# Patient Record
Sex: Female | Born: 1990 | Race: Black or African American | Hispanic: No | Smoking: Former smoker
Health system: Southern US, Community
[De-identification: ages and names within clinical notes are randomized; demographics above are authoritative.]

## PROBLEM LIST (undated history)

## (undated) DIAGNOSIS — Z87898 Personal history of other specified conditions: Secondary | ICD-10-CM

## (undated) DIAGNOSIS — F1110 Opioid abuse, uncomplicated: Secondary | ICD-10-CM

## (undated) DIAGNOSIS — F1491 Cocaine use, unspecified, in remission: Secondary | ICD-10-CM

## (undated) HISTORY — DX: Personal history of other specified conditions: Z87.898

## (undated) HISTORY — DX: Cocaine use, unspecified, in remission: F14.91

## (undated) SURGERY — Surgical Case
Anesthesia: Regional

---
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
## 2010-05-08 ENCOUNTER — Emergency Department (HOSPITAL_COMMUNITY): Admission: EM | Admit: 2010-05-08 | Discharge: 2010-05-08 | Payer: Self-pay | Admitting: Emergency Medicine

## 2010-05-11 ENCOUNTER — Emergency Department (HOSPITAL_COMMUNITY): Admission: EM | Admit: 2010-05-11 | Discharge: 2010-05-11 | Payer: Self-pay | Admitting: Emergency Medicine

## 2010-05-21 ENCOUNTER — Ambulatory Visit: Payer: Self-pay | Admitting: Obstetrics and Gynecology

## 2010-05-21 ENCOUNTER — Inpatient Hospital Stay (HOSPITAL_COMMUNITY): Admission: AD | Admit: 2010-05-21 | Discharge: 2010-05-21 | Payer: Self-pay | Admitting: Obstetrics and Gynecology

## 2010-09-30 LAB — HCG, QUANTITATIVE, PREGNANCY: hCG, Beta Chain, Quant, S: <2 m[IU]/mL (ref <5)

## 2010-10-01 LAB — CBC
HCT: 38.3 % (ref 36.0–46.0)
Hemoglobin: 12.6 g/dL (ref 12.0–15.0)
MCH: 27.7 pg (ref 26.0–34.0)
RDW: 13.5 % (ref 11.5–15.5)
WBC: 6.9 10*3/uL (ref 4.0–10.5)

## 2010-10-01 LAB — WET PREP, GENITAL
Clue Cells Wet Prep HPF POC: NONE SEEN
Trich, Wet Prep: NONE SEEN
Yeast Wet Prep HPF POC: NONE SEEN

## 2010-10-01 LAB — DIFFERENTIAL
Basophils Absolute: 0.1 10*3/uL (ref 0.0–0.1)
Eosinophils Relative: 1 % (ref 0–5)
Lymphs Abs: 2.2 10*3/uL (ref 0.7–4.0)
Monocytes Absolute: 0.6 10*3/uL (ref 0.1–1.0)
Monocytes Relative: 9 % (ref 3–12)
Neutro Abs: 3.9 10*3/uL (ref 1.7–7.7)

## 2010-10-01 LAB — RPR: RPR Ser Ql: NONREACTIVE

## 2010-10-01 LAB — POCT I-STAT, CHEM 8
Calcium, Ion: 1.18 mmol/L (ref 1.12–1.32)
Creatinine, Ser: 1 mg/dL (ref 0.4–1.2)
Glucose, Bld: 84 mg/dL (ref 70–99)
Sodium: 138 mEq/L (ref 135–145)

## 2010-10-01 LAB — HCG, QUANTITATIVE, PREGNANCY: hCG, Beta Chain, Quant, S: 48 m[IU]/mL — ABNORMAL HIGH (ref <5)

## 2010-11-03 ENCOUNTER — Other Ambulatory Visit: Payer: Self-pay | Admitting: Family Medicine

## 2010-11-03 DIAGNOSIS — Z3689 Encounter for other specified antenatal screening: Secondary | ICD-10-CM

## 2010-11-04 ENCOUNTER — Other Ambulatory Visit: Payer: Self-pay | Admitting: Family Medicine

## 2010-11-04 DIAGNOSIS — Z3682 Encounter for antenatal screening for nuchal translucency: Secondary | ICD-10-CM

## 2010-11-12 ENCOUNTER — Ambulatory Visit (HOSPITAL_COMMUNITY)
Admission: RE | Admit: 2010-11-12 | Discharge: 2010-11-12 | Disposition: A | Discharge status: Home / Self Care | Payer: Medicaid Other | Source: Ambulatory Visit | Attending: Family Medicine | Admitting: Family Medicine

## 2010-11-12 ENCOUNTER — Encounter (HOSPITAL_COMMUNITY): Payer: Self-pay

## 2010-11-12 DIAGNOSIS — Z3682 Encounter for antenatal screening for nuchal translucency: Secondary | ICD-10-CM

## 2010-11-12 DIAGNOSIS — Z3689 Encounter for other specified antenatal screening: Secondary | ICD-10-CM | POA: Insufficient documentation

## 2010-11-12 DIAGNOSIS — O3510X0 Maternal care for (suspected) chromosomal abnormality in fetus, unspecified, not applicable or unspecified: Secondary | ICD-10-CM | POA: Insufficient documentation

## 2010-11-12 DIAGNOSIS — O351XX0 Maternal care for (suspected) chromosomal abnormality in fetus, not applicable or unspecified: Secondary | ICD-10-CM | POA: Insufficient documentation

## 2010-12-01 ENCOUNTER — Ambulatory Visit (HOSPITAL_COMMUNITY)
Admission: RE | Admit: 2010-12-01 | Discharge: 2010-12-01 | Disposition: A | Discharge status: Home / Self Care | Payer: Medicaid Other | Source: Ambulatory Visit | Attending: Family Medicine | Admitting: Family Medicine

## 2010-12-01 DIAGNOSIS — O351XX0 Maternal care for (suspected) chromosomal abnormality in fetus, not applicable or unspecified: Secondary | ICD-10-CM | POA: Insufficient documentation

## 2010-12-01 DIAGNOSIS — O3510X0 Maternal care for (suspected) chromosomal abnormality in fetus, unspecified, not applicable or unspecified: Secondary | ICD-10-CM | POA: Insufficient documentation

## 2010-12-25 ENCOUNTER — Ambulatory Visit (HOSPITAL_COMMUNITY)
Admission: RE | Admit: 2010-12-25 | Discharge: 2010-12-25 | Disposition: A | Discharge status: Home / Self Care | Payer: Medicaid Other | Source: Ambulatory Visit | Attending: Family Medicine | Admitting: Family Medicine

## 2010-12-25 DIAGNOSIS — O358XX0 Maternal care for other (suspected) fetal abnormality and damage, not applicable or unspecified: Secondary | ICD-10-CM | POA: Insufficient documentation

## 2010-12-25 DIAGNOSIS — Z363 Encounter for antenatal screening for malformations: Secondary | ICD-10-CM | POA: Insufficient documentation

## 2010-12-25 DIAGNOSIS — Z3689 Encounter for other specified antenatal screening: Secondary | ICD-10-CM

## 2010-12-25 DIAGNOSIS — Z1389 Encounter for screening for other disorder: Secondary | ICD-10-CM | POA: Insufficient documentation

## 2011-02-19 ENCOUNTER — Other Ambulatory Visit: Payer: Self-pay | Admitting: Family Medicine

## 2011-02-19 DIAGNOSIS — O4190X0 Disorder of amniotic fluid and membranes, unspecified, unspecified trimester, not applicable or unspecified: Secondary | ICD-10-CM

## 2011-02-24 ENCOUNTER — Ambulatory Visit (HOSPITAL_COMMUNITY)
Admission: RE | Admit: 2011-02-24 | Discharge: 2011-02-24 | Disposition: A | Discharge status: Home / Self Care | Payer: Medicaid Other | Source: Ambulatory Visit | Attending: Family Medicine | Admitting: Family Medicine

## 2011-02-24 DIAGNOSIS — O4190X0 Disorder of amniotic fluid and membranes, unspecified, unspecified trimester, not applicable or unspecified: Secondary | ICD-10-CM

## 2011-02-24 DIAGNOSIS — O3660X0 Maternal care for excessive fetal growth, unspecified trimester, not applicable or unspecified: Secondary | ICD-10-CM | POA: Insufficient documentation

## 2011-02-24 DIAGNOSIS — Z3689 Encounter for other specified antenatal screening: Secondary | ICD-10-CM | POA: Insufficient documentation

## 2011-04-09 LAB — COMPREHENSIVE METABOLIC PANEL WITH GFR
ALT: 15
AST: 20
Albumin: 2.9 — ABNORMAL LOW
Alkaline Phosphatase: 64
BUN: 3 — ABNORMAL LOW
CO2: 24
Calcium: 8.8
Chloride: 100
Creatinine, Ser: 0.62
GFR calc Af Amer: >60
GFR calc non Af Amer: >60
Glucose, Bld: 79
Potassium: 4.2
Sodium: 132 — ABNORMAL LOW
Total Bilirubin: 1.1
Total Protein: 6.7

## 2011-04-09 LAB — URIC ACID: Uric Acid, Serum: 3.9

## 2011-04-09 LAB — CBC
HCT: 26.6 — ABNORMAL LOW
Hemoglobin: 9 — ABNORMAL LOW
MCHC: 34
MCV: 83.6
Platelets: 206
RBC: 3.18 — ABNORMAL LOW
RDW: 13.9
WBC: 7.9

## 2011-04-09 LAB — URINALYSIS, ROUTINE W REFLEX MICROSCOPIC
Bilirubin Urine: NEGATIVE
Glucose, UA: NEGATIVE
Ketones, ur: >80 — AB
Nitrite: NEGATIVE
Protein, ur: 30 — AB
Specific Gravity, Urine: 1.02
Urobilinogen, UA: 1
pH: 6.5

## 2011-04-09 LAB — WET PREP, GENITAL
Trich, Wet Prep: NONE SEEN
Yeast Wet Prep HPF POC: NONE SEEN

## 2011-04-09 LAB — FETAL FIBRONECTIN: Fetal Fibronectin: NEGATIVE

## 2011-04-09 LAB — URINE MICROSCOPIC-ADD ON

## 2011-04-13 LAB — CBC
HCT: 31 — ABNORMAL LOW
MCHC: 33
MCV: 80.3
RBC: 3.86 — ABNORMAL LOW
WBC: 10.4

## 2011-04-13 LAB — RPR: RPR Ser Ql: NONREACTIVE

## 2011-04-18 ENCOUNTER — Encounter (HOSPITAL_COMMUNITY)
Admission: AD | Disposition: A | Discharge status: Home / Self Care | Payer: Self-pay | Source: Ambulatory Visit | Attending: Obstetrics & Gynecology

## 2011-04-18 ENCOUNTER — Inpatient Hospital Stay (HOSPITAL_COMMUNITY): Payer: Medicaid Other | Admitting: Anesthesiology

## 2011-04-18 ENCOUNTER — Inpatient Hospital Stay (HOSPITAL_COMMUNITY)
Admission: AD | Admit: 2011-04-18 | Discharge: 2011-04-21 | DRG: 765 | Disposition: A | Discharge status: Home / Self Care | Payer: Medicaid Other | Source: Ambulatory Visit | Attending: Obstetrics & Gynecology | Admitting: Obstetrics & Gynecology

## 2011-04-18 ENCOUNTER — Encounter (HOSPITAL_COMMUNITY): Payer: Self-pay | Admitting: Anesthesiology

## 2011-04-18 ENCOUNTER — Encounter (HOSPITAL_COMMUNITY): Payer: Self-pay | Admitting: *Deleted

## 2011-04-18 ENCOUNTER — Other Ambulatory Visit: Payer: Self-pay | Admitting: Obstetrics & Gynecology

## 2011-04-18 DIAGNOSIS — O479 False labor, unspecified: Secondary | ICD-10-CM

## 2011-04-18 DIAGNOSIS — O98519 Other viral diseases complicating pregnancy, unspecified trimester: Principal | ICD-10-CM | POA: Diagnosis present

## 2011-04-18 DIAGNOSIS — A6000 Herpesviral infection of urogenital system, unspecified: Secondary | ICD-10-CM

## 2011-04-18 LAB — CBC
HCT: 31.8 % — ABNORMAL LOW (ref 36.0–46.0)
Hemoglobin: 10.2 g/dL — ABNORMAL LOW (ref 12.0–15.0)
MCH: 26 pg (ref 26.0–34.0)
MCHC: 32.1 g/dL (ref 30.0–36.0)
RDW: 14.3 % (ref 11.5–15.5)

## 2011-04-18 LAB — URINALYSIS, ROUTINE W REFLEX MICROSCOPIC
Bilirubin Urine: NEGATIVE
Nitrite: NEGATIVE
Protein, ur: NEGATIVE mg/dL
Specific Gravity, Urine: <1.005 — ABNORMAL LOW (ref 1.005–1.030)
Urobilinogen, UA: 0.2 mg/dL (ref 0.0–1.0)

## 2011-04-18 LAB — WET PREP, GENITAL: Yeast Wet Prep HPF POC: NONE SEEN

## 2011-04-18 LAB — RAPID URINE DRUG SCREEN, HOSP PERFORMED
Barbiturates: NOT DETECTED
Cocaine: NOT DETECTED
Tetrahydrocannabinol: NOT DETECTED

## 2011-04-18 LAB — URINE MICROSCOPIC-ADD ON

## 2011-04-18 SURGERY — Surgical Case
Anesthesia: Spinal | Site: Abdomen | Wound class: Clean Contaminated

## 2011-04-18 MED ORDER — CEFAZOLIN SODIUM 1-5 GM-% IV SOLN: 1.0000 g | INTRAVENOUS | Status: DC

## 2011-04-18 MED ORDER — BUPIVACAINE HCL (PF) 0.5 % IJ SOLN
INTRAMUSCULAR | Status: DC | PRN
  Administered 2011-04-18: 20 mL

## 2011-04-18 MED ORDER — ZOLPIDEM TARTRATE 5 MG PO TABS: 5.0000 mg | ORAL_TABLET | Freq: Every evening | ORAL | Status: DC | PRN

## 2011-04-18 MED ORDER — WITCH HAZEL-GLYCERIN EX PADS: 1.0000 "application " | MEDICATED_PAD | CUTANEOUS | Status: DC | PRN

## 2011-04-18 MED ORDER — CEFAZOLIN SODIUM 1-5 GM-% IV SOLN
INTRAVENOUS | Status: DC | PRN
  Administered 2011-04-18: 1 g via INTRAVENOUS

## 2011-04-18 MED ORDER — FENTANYL CITRATE 0.05 MG/ML IJ SOLN
INTRAMUSCULAR | Status: DC | PRN
  Administered 2011-04-18: 15 ug via INTRATHECAL

## 2011-04-18 MED ORDER — SODIUM CHLORIDE 0.9 % IJ SOLN: 3.0000 mL | INTRAMUSCULAR | Status: DC | PRN

## 2011-04-18 MED ORDER — ONDANSETRON HCL 4 MG/2ML IJ SOLN: 4.0000 mg | INTRAMUSCULAR | Status: DC | PRN

## 2011-04-18 MED ORDER — OXYTOCIN 20 UNITS IN LACTATED RINGERS INFUSION - SIMPLE
125.0000 mL/h | INTRAVENOUS | Status: AC
  Filled 2011-04-18: qty 1000

## 2011-04-18 MED ORDER — LANOLIN HYDROUS EX OINT: 1.0000 "application " | TOPICAL_OINTMENT | CUTANEOUS | Status: DC | PRN

## 2011-04-18 MED ORDER — MEPERIDINE HCL 25 MG/ML IJ SOLN: 6.2500 mg | INTRAMUSCULAR | Status: DC | PRN

## 2011-04-18 MED ORDER — HYDROMORPHONE HCL 1 MG/ML IJ SOLN: 0.2500 mg | INTRAMUSCULAR | Status: DC | PRN

## 2011-04-18 MED ORDER — SENNOSIDES-DOCUSATE SODIUM 8.6-50 MG PO TABS
2.0000 | ORAL_TABLET | Freq: Every day | ORAL | Status: DC
  Administered 2011-04-18 – 2011-04-20 (×2): 2 via ORAL

## 2011-04-18 MED ORDER — CITRIC ACID-SODIUM CITRATE 334-500 MG/5ML PO SOLN
30.0000 mL | Freq: Once | ORAL | Status: AC
  Administered 2011-04-18: 30 mL via ORAL

## 2011-04-18 MED ORDER — SIMETHICONE 80 MG PO CHEW: 80.0000 mg | CHEWABLE_TABLET | ORAL | Status: DC | PRN

## 2011-04-18 MED ORDER — METOCLOPRAMIDE HCL 5 MG/ML IJ SOLN: 10.0000 mg | Freq: Three times a day (TID) | INTRAMUSCULAR | Status: DC | PRN

## 2011-04-18 MED ORDER — CITRIC ACID-SODIUM CITRATE 334-500 MG/5ML PO SOLN
ORAL | Status: AC
  Filled 2011-04-18: qty 15

## 2011-04-18 MED ORDER — MORPHINE SULFATE (PF) 0.5 MG/ML IJ SOLN
INTRAMUSCULAR | Status: DC | PRN
  Administered 2011-04-18: .1 mg via INTRATHECAL

## 2011-04-18 MED ORDER — KETOROLAC TROMETHAMINE 60 MG/2ML IM SOLN
INTRAMUSCULAR | Status: AC
  Filled 2011-04-18: qty 2

## 2011-04-18 MED ORDER — LACTATED RINGERS IV SOLN: INTRAVENOUS | Status: DC

## 2011-04-18 MED ORDER — ONDANSETRON HCL 4 MG PO TABS: 4.0000 mg | ORAL_TABLET | ORAL | Status: DC | PRN

## 2011-04-18 MED ORDER — OXYTOCIN 10 UNIT/ML IJ SOLN
INTRAMUSCULAR | Status: DC | PRN
  Administered 2011-04-18: 20 [IU]
  Administered 2011-04-18: 20 [IU] via INTRAMUSCULAR

## 2011-04-18 MED ORDER — FAMOTIDINE IN NACL 20-0.9 MG/50ML-% IV SOLN
INTRAVENOUS | Status: AC
  Filled 2011-04-18: qty 50

## 2011-04-18 MED ORDER — NALOXONE HCL 0.4 MG/ML IJ SOLN: 0.4000 mg | INTRAMUSCULAR | Status: DC | PRN

## 2011-04-18 MED ORDER — SODIUM CHLORIDE 0.9 % IV SOLN
1.0000 ug/kg/h | INTRAVENOUS | Status: DC | PRN
  Filled 2011-04-18: qty 2.5

## 2011-04-18 MED ORDER — KETOROLAC TROMETHAMINE 30 MG/ML IJ SOLN: 30.0000 mg | Freq: Four times a day (QID) | INTRAMUSCULAR | Status: AC | PRN

## 2011-04-18 MED ORDER — BUPIVACAINE IN DEXTROSE 0.75-8.25 % IT SOLN
INTRATHECAL | Status: DC | PRN
  Administered 2011-04-18: 1.5 mL via INTRATHECAL

## 2011-04-18 MED ORDER — IBUPROFEN 600 MG PO TABS
600.0000 mg | ORAL_TABLET | Freq: Four times a day (QID) | ORAL | Status: DC
  Administered 2011-04-18 – 2011-04-21 (×11): 600 mg via ORAL
  Filled 2011-04-18: qty 1

## 2011-04-18 MED ORDER — DIPHENHYDRAMINE HCL 50 MG/ML IJ SOLN: 25.0000 mg | INTRAMUSCULAR | Status: DC | PRN

## 2011-04-18 MED ORDER — ONDANSETRON HCL 4 MG/2ML IJ SOLN: 4.0000 mg | Freq: Three times a day (TID) | INTRAMUSCULAR | Status: DC | PRN

## 2011-04-18 MED ORDER — SIMETHICONE 80 MG PO CHEW
80.0000 mg | CHEWABLE_TABLET | Freq: Three times a day (TID) | ORAL | Status: DC
  Administered 2011-04-18 – 2011-04-21 (×11): 80 mg via ORAL

## 2011-04-18 MED ORDER — NALBUPHINE HCL 10 MG/ML IJ SOLN
5.0000 mg | INTRAMUSCULAR | Status: DC | PRN
  Administered 2011-04-18: 5 mg via SUBCUTANEOUS
  Filled 2011-04-18: qty 1

## 2011-04-18 MED ORDER — LACTATED RINGERS IV SOLN
INTRAVENOUS | Status: DC
  Administered 2011-04-18 (×3): via INTRAVENOUS

## 2011-04-18 MED ORDER — IBUPROFEN 600 MG PO TABS
600.0000 mg | ORAL_TABLET | Freq: Four times a day (QID) | ORAL | Status: DC | PRN
  Administered 2011-04-19 – 2011-04-20 (×2): 600 mg via ORAL
  Filled 2011-04-18 (×12): qty 1

## 2011-04-18 MED ORDER — PRENATAL PLUS 27-1 MG PO TABS
1.0000 | ORAL_TABLET | Freq: Every day | ORAL | Status: DC
  Administered 2011-04-18 – 2011-04-21 (×4): 1 via ORAL
  Filled 2011-04-18 (×4): qty 1

## 2011-04-18 MED ORDER — TERBUTALINE SULFATE 1 MG/ML IJ SOLN
0.2500 mg | Freq: Once | INTRAMUSCULAR | Status: AC
  Administered 2011-04-18: 0.25 mg via SUBCUTANEOUS
  Filled 2011-04-18: qty 1

## 2011-04-18 MED ORDER — VALACYCLOVIR HCL 500 MG PO TABS
1000.0000 mg | ORAL_TABLET | Freq: Every day | ORAL | Status: DC
  Administered 2011-04-18 – 2011-04-21 (×4): 1000 mg via ORAL
  Filled 2011-04-18 (×4): qty 2

## 2011-04-18 MED ORDER — SCOPOLAMINE 1 MG/3DAYS TD PT72
1.0000 | MEDICATED_PATCH | Freq: Once | TRANSDERMAL | Status: AC
  Administered 2011-04-18: 1.5 mg via TRANSDERMAL

## 2011-04-18 MED ORDER — OXYCODONE-ACETAMINOPHEN 5-325 MG PO TABS
1.0000 | ORAL_TABLET | ORAL | Status: DC | PRN
  Administered 2011-04-18 – 2011-04-19 (×2): 1 via ORAL
  Administered 2011-04-19: 2 via ORAL
  Administered 2011-04-19: 1 via ORAL
  Administered 2011-04-20 – 2011-04-21 (×2): 2 via ORAL
  Filled 2011-04-18 (×5): qty 1
  Filled 2011-04-18 (×2): qty 2

## 2011-04-18 MED ORDER — DIPHENHYDRAMINE HCL 50 MG/ML IJ SOLN: 12.5000 mg | INTRAMUSCULAR | Status: DC | PRN

## 2011-04-18 MED ORDER — DIPHENHYDRAMINE HCL 25 MG PO CAPS
25.0000 mg | ORAL_CAPSULE | ORAL | Status: DC | PRN
  Administered 2011-04-18 (×2): 25 mg via ORAL
  Filled 2011-04-18 (×2): qty 1

## 2011-04-18 MED ORDER — DIBUCAINE 1 % RE OINT: 1.0000 "application " | TOPICAL_OINTMENT | RECTAL | Status: DC | PRN

## 2011-04-18 MED ORDER — FAMOTIDINE IN NACL 20-0.9 MG/50ML-% IV SOLN
20.0000 mg | Freq: Once | INTRAVENOUS | Status: AC
  Administered 2011-04-18: 20 mg via INTRAVENOUS

## 2011-04-18 MED ORDER — KETOROLAC TROMETHAMINE 60 MG/2ML IM SOLN
60.0000 mg | Freq: Once | INTRAMUSCULAR | Status: AC | PRN
  Administered 2011-04-18: 60 mg via INTRAMUSCULAR

## 2011-04-18 MED ORDER — ONDANSETRON HCL 4 MG/2ML IJ SOLN
INTRAMUSCULAR | Status: DC | PRN
  Administered 2011-04-18: 4 mg via INTRAVENOUS

## 2011-04-18 MED ORDER — DIPHENHYDRAMINE HCL 25 MG PO CAPS: 25.0000 mg | ORAL_CAPSULE | Freq: Four times a day (QID) | ORAL | Status: DC | PRN

## 2011-04-18 MED ORDER — SCOPOLAMINE 1 MG/3DAYS TD PT72
MEDICATED_PATCH | TRANSDERMAL | Status: AC
  Filled 2011-04-18: qty 1

## 2011-04-18 MED ORDER — MENTHOL 3 MG MT LOZG: 1.0000 | LOZENGE | OROMUCOSAL | Status: DC | PRN

## 2011-04-18 MED ORDER — NALBUPHINE HCL 10 MG/ML IJ SOLN
5.0000 mg | INTRAMUSCULAR | Status: DC | PRN
  Filled 2011-04-18: qty 1

## 2011-04-18 MED ORDER — PHENYLEPHRINE HCL 10 MG/ML IJ SOLN
INTRAMUSCULAR | Status: DC | PRN
  Administered 2011-04-18: 80 ug via INTRAVENOUS
  Administered 2011-04-18: 40 ug via INTRAVENOUS
  Administered 2011-04-18 (×3): 80 ug via INTRAVENOUS
  Administered 2011-04-18 (×2): 40 ug via INTRAVENOUS
  Administered 2011-04-18: 80 ug via INTRAVENOUS

## 2011-04-18 MED ORDER — TETANUS-DIPHTH-ACELL PERTUSSIS 5-2.5-18.5 LF-MCG/0.5 IM SUSP
0.5000 mL | Freq: Once | INTRAMUSCULAR | Status: AC
Start: 2011-04-19 — End: 2011-04-19
  Administered 2011-04-19: 0.5 mL via INTRAMUSCULAR
  Filled 2011-04-18: qty 0.5

## 2011-04-18 SURGICAL SUPPLY — 36 items
CHLORAPREP W/TINT 26ML (MISCELLANEOUS) ×2 IMPLANT
CLOSURE STERI STRIP 1/2 X4 (GAUZE/BANDAGES/DRESSINGS) ×1 IMPLANT
CLOTH BEACON ORANGE TIMEOUT ST (SAFETY) ×2 IMPLANT
CONTAINER PREFILL 10% NBF 15ML (MISCELLANEOUS) IMPLANT
DRESSING TELFA 8X3 (GAUZE/BANDAGES/DRESSINGS) ×1 IMPLANT
ELECT REM PT RETURN 9FT ADLT (ELECTROSURGICAL) ×2
ELECTRODE REM PT RTRN 9FT ADLT (ELECTROSURGICAL) ×1 IMPLANT
GAUZE SPONGE 4X4 12PLY STRL LF (GAUZE/BANDAGES/DRESSINGS) IMPLANT
GLOVE BIO SURGEON STRL SZ 6.5 (GLOVE) ×4 IMPLANT
GOWN PREVENTION PLUS LG XLONG (DISPOSABLE) ×6 IMPLANT
KIT ABG SYR 3ML LUER SLIP (SYRINGE) IMPLANT
NDL HYPO 25X5/8 SAFETYGLIDE (NEEDLE) IMPLANT
NDL SPNL 18GX3.5 QUINCKE PK (NEEDLE) ×1 IMPLANT
NEEDLE HYPO 25X5/8 SAFETYGLIDE (NEEDLE) IMPLANT
NEEDLE SPNL 18GX3.5 QUINCKE PK (NEEDLE) ×2 IMPLANT
NS IRRIG 1000ML POUR BTL (IV SOLUTION) ×2 IMPLANT
PACK C SECTION WH (CUSTOM PROCEDURE TRAY) ×2 IMPLANT
PAD ABD 7.5X8 STRL (GAUZE/BANDAGES/DRESSINGS) ×1 IMPLANT
SLEEVE SCD COMPRESS KNEE MED (MISCELLANEOUS) IMPLANT
SPONGE GAUZE 4X4 12PLY (GAUZE/BANDAGES/DRESSINGS) ×1 IMPLANT
SUT PDS AB 0 CTX 60 (SUTURE) IMPLANT
SUT VIC AB 0 CT1 27 (SUTURE)
SUT VIC AB 0 CT1 27XBRD ANBCTR (SUTURE) IMPLANT
SUT VIC AB 0 CT1 36 (SUTURE) IMPLANT
SUT VIC AB 2-0 CT1 27 (SUTURE) ×2
SUT VIC AB 2-0 CT1 TAPERPNT 27 (SUTURE) ×1 IMPLANT
SUT VIC AB 2-0 CTX 36 (SUTURE) ×4 IMPLANT
SUT VIC AB 3-0 CT1 27 (SUTURE) ×2
SUT VIC AB 3-0 CT1 TAPERPNT 27 (SUTURE) ×1 IMPLANT
SUT VIC AB 3-0 SH 27 (SUTURE)
SUT VIC AB 3-0 SH 27X BRD (SUTURE) IMPLANT
SYR 30ML LL (SYRINGE) ×2 IMPLANT
TAPE CLOTH SURG 4X10 WHT LF (GAUZE/BANDAGES/DRESSINGS) ×1 IMPLANT
TOWEL OR 17X24 6PK STRL BLUE (TOWEL DISPOSABLE) ×4 IMPLANT
TRAY FOLEY CATH 14FR (SET/KITS/TRAYS/PACK) ×2 IMPLANT
WATER STERILE IRR 1000ML POUR (IV SOLUTION) ×2 IMPLANT

## 2011-04-18 NOTE — Anesthesia Procedure Notes (Signed)
Spinal Block  Patient location during procedure: OR Start time: 04/18/2011 2:49 AM Staffing Performed by: anesthesiologist  Preanesthetic Checklist Completed: patient identified, site marked, surgical consent, pre-op evaluation, timeout performed, IV checked, risks and benefits discussed and monitors and equipment checked Spinal Block Patient position: sitting Prep: site prepped and draped and DuraPrep Patient monitoring: heart rate, cardiac monitor, continuous pulse ox and blood pressure Approach: midline Location: L3-4 Injection technique: single-shot Needle Needle type: Sprotte  Needle gauge: 24 G Needle length: 9 cm Assessment Sensory level: T4

## 2011-04-18 NOTE — Op Note (Signed)
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  Post-operative Diagnosis: same  Surgeon: Nicholaus Bloom, MD  Assistants: Lucina Mellow, Do  Anesthesia: Spinal anesthesia  ASA Class: 3   Procedure Details   The patient was admitted for c-section from the MAU. The risks, benefits, complications, treatment options, and expected outcomes were discussed with the patient.  The patient concurred with the proposed plan, giving informed consent.  The site of surgery properly noted/marked. The patient was taken to Operating Room # 2, identified as Kelly Gordon and the procedure verified as C-Section Delivery. A Time Out was held and the above information confirmed.  After induction of anesthesia, the patient was draped and prepped in the usual sterile manner. A Pfannenstiel incision was made and carried down through the subcutaneous tissue to the fascia. Fascial incision was made and extended transversely. The rectus muscles were transected less than 1/3 of the diameter laterally and traction was used to extend the incisional opening. The peritoneum was identified and entered. A low transverse uterine incision was made. Delivered from cephalic presentation was a  Female with Apgar scores of 8 at one minute and 9 at five minutes. Weight is still pending at the time of this note. After the umbilical cord was clamped and cut cord blood was obtained for evaluation. The placenta was removed intact and appeared normal. The uterine outline, tubes and ovaries appeared normal. The uterine incision was closed with running locked sutures of 2.0 and Vicryl. Hemostasis was observed. Lavage was carried out until clear. The fascia was then reapproximated with running sutures of Vicryl. The skin was reapproximated with Vicryl.  Instrument, sponge, and needle counts were correct prior the abdominal  closure and at the conclusion of the case.   Findings: Viable infant female, normal uterus, placenta and ovaries.  Estimated Blood Loss:  500 mL         Total IV Fluids:  3000 ml         Specimens: Placenta to pathology         Complications:  None; patient tolerated the procedure well.         Disposition: PACU - hemodynamically stable.         Condition: stable

## 2011-04-18 NOTE — H&P (Signed)
Kelly Gordon is a 20 y.o. female presenting for contractions. Maternal Medical History:  Reason for admission: Reason for admission: contractions.  Contractions: Onset was 3-5 hours ago.   Frequency: regular.   Duration is approximately 90 seconds.   Perceived severity is strong.    Fetal activity: Perceived fetal activity is normal.   Last perceived fetal movement was within the past hour.    Prenatal complications: Herpes outbreak 4 days ago  Prenatal Complications - Diabetes: none.    OB History    Grav Para Term Preterm Abortions TAB SAB Ect Mult Living   3 1 1  1  1   1      Past Medical History  Diagnosis Date  . No pertinent past medical history    Past Surgical History  Procedure Date  . No past surgeries    Family History: family history is not on file. Social History:  reports that she has never smoked. She does not have any smokeless tobacco history on file. She reports that she does not drink alcohol or use illicit drugs.  Review of Systems  Constitutional: Negative.   HENT: Negative.   Eyes: Negative.   Respiratory: Negative.   Cardiovascular: Negative.   Gastrointestinal: Negative.   Genitourinary: Negative.   Musculoskeletal: Negative.   Skin: Positive for rash. Negative for itching.       Herpetic rash with lesions on labia and close to rectum  Neurological: Negative.   Psychiatric/Behavioral: Negative.       Blood pressure 108/66, pulse 101, temperature 98.5 F (36.9 C), temperature source Oral, resp. rate 20, height 5' 1.75" (1.568 m), weight 143 lb 4 oz (64.978 kg). Maternal Exam:  Uterine Assessment: Contraction strength is firm.  Contraction duration is 60 seconds. Contraction frequency is regular.   Abdomen: Estimated fetal weight is 6 pounds.   Fetal presentation: vertex  Introitus: Vulva is positive for lesion. Ferning test: negative.  Nitrazine test: not done.  Pelvis: adequate for delivery.   Cervix: Cervix evaluated by digital  exam.     Fetal Exam Fetal Monitor Review: Mode: ultrasound.   Baseline rate: 145-150.  Variability: moderate (6-25 bpm).   Pattern: accelerations present and no decelerations.    Fetal State Assessment: Category I - tracings are normal.     Physical Exam  Constitutional: She is oriented to person, place, and time. She appears well-developed and well-nourished.  HENT:  Head: Normocephalic.  Eyes: Pupils are equal, round, and reactive to light.  Neck: Normal range of motion.  Cardiovascular: Normal rate, regular rhythm and intact distal pulses.  Exam reveals friction rub. Exam reveals no gallop.   No murmur heard. Respiratory: Effort normal and breath sounds normal. No respiratory distress. She has no wheezes. She has no rales. She exhibits no tenderness.  GI: Soft. Bowel sounds are normal. She exhibits no distension. There is no tenderness.  Genitourinary:    Vulva exhibits lesion.  Musculoskeletal: Normal range of motion.  Neurological: She is alert and oriented to person, place, and time. She has normal reflexes. She displays normal reflexes. No cranial nerve deficit. Coordination normal.  Skin: Skin is warm and dry. No rash noted. She is not diaphoretic. No erythema. No pallor.  Psychiatric: She has a normal mood and affect.    Prenatal labs: ABO, Rh: --/--/O POS (10/23 1534) Antibody:   Rubella:  Immune RPR: NON REACTIVE (10/23 1537)  HBsAg:   Neg HIV:   Neg GBS:   unk  Assessment/Plan: IUP @ 34.[redacted]  wks EGA, Cervical exam 6 cm/100%/BBOW with active herpetic lesions. Will admit and perform STAT c-section due to active herpes lesions. Will cont valtrex postpartum. NICU notified. GBS, GC/Ch cultures obtained.  Nuha Degner N 04/18/2011, 1:54 AM

## 2011-04-18 NOTE — Transfer of Care (Signed)
Immediate Anesthesia Transfer of Care Note  Patient: Kelly Gordon  Procedure(s) Performed:  CESAREAN SECTION  Patient Location: PACU  Anesthesia Type: Spinal  Level of Consciousness: awake, alert , oriented and patient cooperative  Airway & Oxygen Therapy: Patient Spontanous Breathing  Post-op Assessment: Report given to PACU RN and Post -op Vital signs reviewed and stable  Post vital signs: Reviewed and stable  Complications: No apparent anesthesia complications

## 2011-04-18 NOTE — Anesthesia Postprocedure Evaluation (Signed)
Anesthesia Post Note  Patient: Kelly Gordon  Procedure(s) Performed:  CESAREAN SECTION  Anesthesia type: Spinal  Patient location: PACU  Post pain: Pain level controlled  Post assessment: Post-op Vital signs reviewed  Last Vitals:  Filed Vitals:   04/18/11 0415  BP: 84/43  Pulse: 82  Temp:   Resp: 18    Post vital signs: Reviewed  Level of consciousness: awake  Complications: No apparent anesthesia complications

## 2011-04-18 NOTE — Anesthesia Preprocedure Evaluation (Signed)
Anesthesia Evaluation  Name, MR# and DOB Patient awake  General Assessment Comment  Reviewed: Allergy & Precautions, H&P , NPO status , Patient's Chart, lab work & pertinent test results, reviewed documented beta blocker date and time   History of Anesthesia Complications Negative for: history of anesthetic complications  Airway Mallampati: I TM Distance: >3 FB Neck ROM: full    Dental  (+)    Pulmonary  clear to auscultation  breath sounds clear to auscultation none    Cardiovascular regular Normal    Neuro/Psych Negative Neurological ROS  Negative Psych ROS  GI/Hepatic/Renal negative GI ROS  negative Liver ROS  negative Renal ROS        Endo/Other  Negative Endocrine ROS (+)      Abdominal   Musculoskeletal   Hematology negative hematology ROS (+)   Peds  Reproductive/Obstetrics (+) Pregnancy (active HSV in labor)    Anesthesia Other Findings NPO since 8 pm - pizza and water            Anesthesia Physical Anesthesia Plan  ASA: II  Anesthesia Plan: Spinal   Post-op Pain Management:    Induction:   Airway Management Planned:   Additional Equipment:   Intra-op Plan:   Post-operative Plan:   Informed Consent: I have reviewed the patients History and Physical, chart, labs and discussed the procedure including the risks, benefits and alternatives for the proposed anesthesia with the patient or authorized representative who has indicated his/her understanding and acceptance.   Dental Advisory Given  Plan Discussed with: CRNA and Surgeon  Anesthesia Plan Comments:         Anesthesia Quick Evaluation

## 2011-04-18 NOTE — Progress Notes (Signed)
   PSYCHOSOCIAL ASSESSMENT ~ MATERNAL/CHILD Name: Nakeysha Pasqual        Age 20  Referral Date 04/18/11    Reason/Source NICU referral I. FAMILY/HOME ENVIRONMENT A. Child's Legal Guardian _X__Parent(s) ___Grandparent ___Foster parent ___DSS_________________ Name: Carlyle Dolly    DOB 10[birth year]19[birth year]1988  Age: 68 Address: [address] [place], [place]  Name_______________________________ DOB___/____/____ Age_____ Address________________________________________________________ B. Other Household Members/Support Persons Name_____________________Relationship____________ DOB ___/___/___ Name_____________________Relationship____________ DOB ___/___/___ Name_____________________Relationship____________ DOB ___/___/___ Name_____________________Relationship____________ DOB ___/___/___ C. Other Support: family II. PSYCHOSOCIAL DATA A. Information Source _X_Patient Interview __Family Interview __Other___________ B. Archivist __________________________________________________ _X_Medicaid County: [place]  __Private Insurance_________ __Self Pay  __Food Stamps _X_WIC __Work First __Public Housing __Section 8  __Maternity Care Coordination/Child Service Coordination/Early Intervention _________________________________________________________________School _____________________________________Grade____________ __Other________________________________________________________ C. Cultural and Environment Information Cultural Issues Impacting Care: None reported III. STRENGTHS __X_Supportive family/friends _X__Adequate Resources _X__Compliance with medical plan _X__Home prepared for Child (including basic supplies) _X__Understanding of illness  ___Other__________________________________________________________ IV. RISK FACTORS AND CURRENT PROBLEMS ____ No Problems Noted Pt Family  Substance Abuse ___ ___  Mental Illness ___ ___ Family/Relationship Issues ___  ___ Abuse/Neglect/Domestic Violence ___ ___ Financial Resources ___ ___ Transportation ___ ___ DSS Involvement ___ ___ Adjustment to Illness: Baby in NICU Knowledge/Cognitive Deficit ___ ___ Compliance with Treatment ___ ___ Basic Needs (food, housing, etc.) ___ ___ Housing Concerns ___ ___ Other_____________________________________________________________ V. SOCIAL WORK ASSESSMENT  CSW met with MOB in 3rd room floor to introduce myself, complete assessment, and evaluate how she is coping with baby's admission to NICU. CSW provided MOB with NICU brochure and explained CSW role. MOB reported baby was born 6 weeks early and therefore was admitted to NICU. MOB stated RN had recently called her and informed her that baby needed an IV since he was not eating enough. MOB stated she wished baby was not in NICU but that family and friends were supportive and assisting her during this time. MOB reports that she will have transportation to visit baby and is not working so she can visit whenever she wishes. MOB stated she has all resources needed but has not bought diapers yet due to baby's early arrival. MOB has already called [facility] and set up an appointment and stated she would call Medicaid regarding baby's arrival. CSW provided MOB with brochure/contact information and explained support services offered by NICU CSW.     VI. SOCIAL WORK PLAN ___No Further Intervention Required/No Barriers to Discharge  _X__Psychosocial Support and Ongoing Assessment of Needs  _X__Patient/Family Education:NICU brochure  ___Child Protective Services Report County___________ Date___/____/____  ___Information/Referral to [employer]_________________________  ___Other__________________________________________________________

## 2011-04-18 NOTE — ED Provider Notes (Signed)
Patient will be admitted for c-section. See H&P

## 2011-04-18 NOTE — Progress Notes (Signed)
Pt states, " I started having contractions at 7pm tonight, they are different than braxton hicks. I drank water and it helped a little bit, but now they are four minutes apart and stronger."

## 2011-04-18 NOTE — Progress Notes (Signed)
"  I have been having UC's for 4 hours every 6 mins.  I was in the clinic yesterday (04/16/11) and I wasn't contracting nor was I dilated."

## 2011-04-19 ENCOUNTER — Encounter (HOSPITAL_COMMUNITY): Payer: Self-pay | Admitting: *Deleted

## 2011-04-19 LAB — CBC
HCT: 29.5 % — ABNORMAL LOW (ref 36.0–46.0)
MCV: 81.9 fL (ref 78.0–100.0)
Platelets: 168 10*3/uL (ref 150–400)
RBC: 3.6 MIL/uL — ABNORMAL LOW (ref 3.87–5.11)
WBC: 12.2 10*3/uL — ABNORMAL HIGH (ref 4.0–10.5)

## 2011-04-19 NOTE — Anesthesia Postprocedure Evaluation (Signed)
  Anesthesia Post-op Note  Patient: Kelly Gordon  Procedure(s) Performed:  CESAREAN SECTION  Patient Location: PACU and Women's Unit  Anesthesia Type: Spinal  Level of Consciousness: awake, alert  and oriented  Airway and Oxygen Therapy: Patient Spontanous Breathing   Post-op Assessment: Patient's Cardiovascular Status Stable and Respiratory Function Stable  Post-op Vital Signs: stable  Complications: No apparent anesthesia complications

## 2011-04-19 NOTE — Progress Notes (Signed)
Encounter addended by: Len Blalock on: 04/19/2011  2:34 PM<BR>     Documentation filed: Notes Section

## 2011-04-19 NOTE — Plan of Care (Signed)
Problem: Discharge Progression Outcomes Goal: Remove staples per MD order Outcome: Not Applicable Date Met:  04/19/11 Steri strips in place.

## 2011-04-19 NOTE — Progress Notes (Signed)
Post Partum Day 1; SP PLTCS for PTL/Active Herpes Subjective: no complaints, up ad lib, voiding, tolerating PO and no flatus at this time.  Infant in NICU, desires additional day room-in if possible.    Objective: Blood pressure 99/62, pulse 74, temperature 98.3 F (36.8 C), temperature source Oral, resp. rate 18, height 5' 1.75" (1.568 m), weight 64.978 kg (143 lb 4 oz), SpO2 98.00%.  Physical Exam:  General: alert and cooperative CVS:  RRR, without murmur, gallops or rubs Lungs:  CTA biliat Lochia: appropriate Abdomen:  Distended, hypoactive bowel sounds Uterine Fundus: Firm, @ umbilicus;  Incision: healing well, no significant drainage, no dehiscence, no significant erythema DVT Evaluation: No evidence of DVT seen on physical exam.   Basename 04/19/11 0533 04/18/11 0157  HGB 9.2* 10.2*  HCT 29.5* 31.8*    Assessment/Plan: Continue PP Care Reassess in the AM   LOS: 1 day   [facility] 04/19/2011, 7:07 AM

## 2011-04-20 ENCOUNTER — Encounter (HOSPITAL_COMMUNITY): Payer: Self-pay | Admitting: Obstetrics & Gynecology

## 2011-04-20 LAB — CULTURE, BETA STREP (GROUP B ONLY)

## 2011-04-20 LAB — GC/CHLAMYDIA PROBE AMP, GENITAL
Chlamydia, DNA Probe: NEGATIVE
GC Probe Amp, Genital: NEGATIVE

## 2011-04-20 NOTE — Progress Notes (Signed)
UR chart review completed.  

## 2011-04-20 NOTE — Progress Notes (Signed)
Subjective: Postpartum Day 2: Cesarean Delivery Patient reports incisional pain, tolerating PO, + flatus and no problems voiding.    Objective: Vital signs in last 24 hours: Temp:  [97.4 F (36.3 C)-98.9 F (37.2 C)] 98.9 F (37.2 C) (10/01 0600) Pulse Rate:  [63-98] 93  (10/01 0600) Resp:  [18-20] 20  (10/01 0600) BP: (98-112)/(57-68) 112/68 mmHg (10/01 0600) SpO2:  [98 %-100 %] 98 % (10/01 0600)  Physical Exam:  General: alert, cooperative, appears stated age and no distress Lochia: appropriate Uterine Fundus: firm Incision: healing well, no significant drainage, no dehiscence, no significant erythema DVT Evaluation: No evidence of DVT seen on physical exam. Negative Homan's sign. No cords or calf tenderness. No significant calf/ankle edema.   Basename 04/19/11 0533 04/18/11 0157  HGB 9.2* 10.2*  HCT 29.5* 31.8*    Assessment/Plan: Status post Cesarean section. Doing well postoperatively.  Continue current care.  Kelly Gordon 04/20/2011, 6:53 AM

## 2011-04-21 MED ORDER — DOCUSATE SODIUM 50 MG PO CAPS: 100.0000 mg | ORAL_CAPSULE | Freq: Two times a day (BID) | ORAL | Status: AC | PRN

## 2011-04-21 MED ORDER — POLYETHYLENE GLYCOL 3350 17 G PO PACK
17.0000 g | PACK | Freq: Every day | ORAL | Status: DC
  Administered 2011-04-21: 17 g via ORAL
  Filled 2011-04-21: qty 1

## 2011-04-21 MED ORDER — OXYCODONE-ACETAMINOPHEN 5-325 MG PO TABS: 1.0000 | ORAL_TABLET | Freq: Four times a day (QID) | ORAL | Status: AC | PRN

## 2011-04-21 MED ORDER — IBUPROFEN 600 MG PO TABS: 600.0000 mg | ORAL_TABLET | Freq: Four times a day (QID) | ORAL | Status: AC | PRN

## 2011-04-21 NOTE — Progress Notes (Signed)
Pt discharged to home with significant other and toddler son.  Condition stable.  Pt and family ambulated off unit, but did not notify staff at the time of their leaving.  No equipment for home ordered at discharge.

## 2011-04-21 NOTE — Progress Notes (Signed)
Subjective: Postpartum Day 3: Cesarean Delivery Patient reports incisional pain, tolerating PO and + flatus.  Patient denies bowel movement despite attempts. Also notes that her pain is a 5/10 in her lower abdomen and thinks that another medication may control it better.   Objective: Vital signs in last 24 hours: Temp:  [98.4 F (36.9 C)-98.6 F (37 C)] 98.5 F (36.9 C) (10/02 0526) Pulse Rate:  [83-93] 93  (10/02 0526) Resp:  [18-20] 18  (10/02 0526) BP: (106-110)/(65-70) 110/70 mmHg (10/02 0526) SpO2:  [97 %-100 %] 100 % (10/02 0526)  Physical Exam:  General: alert, cooperative and no distress Lochia: appropriate Uterine Fundus: firm Incision: healing well, no significant drainage, no dehiscence, no significant erythema DVT Evaluation: No evidence of DVT seen on physical exam.   Basename 04/19/11 0533  HGB 9.2*  HCT 29.5*    Assessment/Plan: Status post Cesarean section. Doing well postoperatively.  Continue current care.   Clinton Sawyer, Daryus Sowash 04/21/2011, 7:17 AM

## 2011-04-21 NOTE — Discharge Summary (Signed)
Obstetric Discharge Summary Reason for Admission: onset of labor and cesarean section Prenatal Procedures: ultrasound Intrapartum Procedures: cesarean: low cervical, transverse Postpartum Procedures: none Complications-Operative and Postpartum: none Hemoglobin  Date Value Range Status  04/19/2011 9.2* 12.0-15.0 (g/dL) Final     HCT  Date Value Range Status  04/19/2011 29.5* 36.0-46.0 (%) Final    Brief Hoppital Course: Mrs. Momon is 20 year old female who presented on 9/29 at  @ 34.[redacted] wks EGA, Cervical exam 6 cm/100%/BBOW with active herpetic lesions. A stat c-section was performed by Dr. Marice Potter and Dr. Natale Milch. The procedure was a lower transverse c-section without complications. The patient was kept for three days post-operatively for monitoring. At the time of discharge the patient notes lower abdominal pain that is improving. She denies fever, chills, and bleeding. Her incision is healing without dehiscence or evidence of infection. She is clinically appropriate for discharge.    Discharge Diagnoses: 1. Preterm Labor 2. Genital Herpes - Active 3. Delivery via Cesarean Section   Discharge Information: Date: 04/21/2011 Activity: pelvic rest Diet: routine Medications: Colace, Percocet and Acyclovir Condition: stable Instructions: refer to practice specific booklet Discharge to: home   Newborn Data: Live born female  Birth Weight: 4 lb 9.1 oz (2073 g) APGAR: 8, 9  Baby to remain in NICU.  Mat Carne 04/21/2011, 12:18 PM

## 2011-04-24 ENCOUNTER — Encounter (HOSPITAL_COMMUNITY): Payer: Self-pay

## 2011-04-30 LAB — URINALYSIS, ROUTINE W REFLEX MICROSCOPIC
Bilirubin Urine: NEGATIVE
Glucose, UA: NEGATIVE
Hgb urine dipstick: NEGATIVE
Ketones, ur: NEGATIVE
Ketones, ur: NEGATIVE
Protein, ur: NEGATIVE
Protein, ur: NEGATIVE
Urobilinogen, UA: 0.2
Urobilinogen, UA: 1

## 2011-04-30 LAB — WET PREP, GENITAL
Trich, Wet Prep: NONE SEEN
Yeast Wet Prep HPF POC: NONE SEEN

## 2011-04-30 LAB — URINE CULTURE: Colony Count: 6000

## 2011-04-30 LAB — GC/CHLAMYDIA PROBE AMP, GENITAL
Chlamydia, DNA Probe: NEGATIVE
GC Probe Amp, Genital: NEGATIVE

## 2011-04-30 LAB — URINE MICROSCOPIC-ADD ON

## 2013-04-13 ENCOUNTER — Encounter (HOSPITAL_COMMUNITY): Payer: Self-pay | Admitting: Emergency Medicine

## 2013-04-13 ENCOUNTER — Emergency Department (HOSPITAL_COMMUNITY)
Admission: EM | Admit: 2013-04-13 | Discharge: 2013-04-13 | Disposition: A | Discharge status: Home / Self Care | Payer: Medicaid Other | Attending: Emergency Medicine | Admitting: Emergency Medicine

## 2013-04-13 DIAGNOSIS — H9202 Otalgia, left ear: Secondary | ICD-10-CM

## 2013-04-13 DIAGNOSIS — J069 Acute upper respiratory infection, unspecified: Secondary | ICD-10-CM | POA: Insufficient documentation

## 2013-04-13 DIAGNOSIS — H9209 Otalgia, unspecified ear: Secondary | ICD-10-CM | POA: Insufficient documentation

## 2013-04-13 MED ORDER — IBUPROFEN 800 MG PO TABS
800.0000 mg | ORAL_TABLET | Freq: Once | ORAL | Status: AC
  Administered 2013-04-13: 800 mg via ORAL
  Filled 2013-04-13: qty 1

## 2013-04-13 MED ORDER — OXYMETAZOLINE HCL 0.05 % NA SOLN
1.0000 | Freq: Once | NASAL | Status: AC
  Administered 2013-04-13: 1 via NASAL
  Filled 2013-04-13: qty 15

## 2013-04-13 MED ORDER — CETIRIZINE-PSEUDOEPHEDRINE ER 5-120 MG PO TB12: 1.0000 | ORAL_TABLET | Freq: Every day | ORAL | Status: DC

## 2013-04-13 MED ORDER — IBUPROFEN 800 MG PO TABS: 800.0000 mg | ORAL_TABLET | Freq: Three times a day (TID) | ORAL | Status: DC | PRN

## 2013-04-13 MED ORDER — GUAIFENESIN ER 600 MG PO TB12: 1200.0000 mg | ORAL_TABLET | Freq: Two times a day (BID) | ORAL | Status: DC

## 2013-04-13 NOTE — ED Notes (Signed)
Bed: AV40 Expected date:  Expected time:  Means of arrival:  Comments: EMS 22yo F, ear pain, URI

## 2013-04-13 NOTE — ED Provider Notes (Signed)
Medical screening examination/treatment/procedure(s) were performed by non-physician practitioner and as supervising physician I was immediately available for consultation/collaboration.  Arkie Tagliaferro, MD 04/13/13 0619 

## 2013-04-13 NOTE — ED Notes (Signed)
Brought in by EMS from home with c/o non-productive cough x 2 days and left ear pain that started 2 hours ago.  Per pt, left ear "feels like there's something fluttering iside", also states pain worse when she moves her head from side to side.

## 2013-04-13 NOTE — ED Provider Notes (Signed)
CSN: [id number]     Arrival date & time 04/13/13  0135 History   First MD Initiated Contact with Patient 04/13/13 0144     Chief Complaint  Patient presents with  . Cough  . Otalgia   HPI  History provided by the patient. Patient is a 22 year old female with no significant PMH presenting with complaints of left ear pain, runny nose, and cough. Patient has had slight runny nose and occasional cough that started 2 days ago. She does report having young children recently sick with URI symptoms. Yesterday evening around 9 PM she began having acutely worsening sharp left ear pain and slight decreased hearing. Pain is worse with some movements of head.  She did not take any medications for her symptoms. She has not had any fever, chills or sweats. No episodes of vomiting or diarrhea. He denies any civic aggravating or alleviating factors.    Past Medical History  Diagnosis Date  . No pertinent past medical history    Past Surgical History  Procedure Laterality Date  . No past surgeries    . Cesarean section  04/18/2011    Procedure: CESAREAN SECTION;  Surgeon: Hollie Salk C. Marice Potter, MD;  Location: [facility];  Service: Gynecology;  Laterality: N/A;   History reviewed. No pertinent family history. History  Substance Use Topics  . Smoking status: Never Smoker   . Smokeless tobacco: Not on file  . Alcohol Use: No   OB History   Grav Para Term Preterm Abortions TAB SAB Ect Mult Living   3 2 1 1 1  1   2      Review of Systems  Constitutional: Negative for fever, chills and diaphoresis.  HENT: Positive for hearing loss, ear pain, sore throat and rhinorrhea. Negative for congestion, neck pain, neck stiffness, sinus pressure and ear discharge.   Respiratory: Positive for cough.   Gastrointestinal: Negative for vomiting, abdominal pain and diarrhea.  All other systems reviewed and are negative.    Allergies  Review of patient's allergies indicates no known allergies.  Home Medications  No current  outpatient prescriptions on file. BP 110/68  Pulse 73  Temp(Src) 98.3 F (36.8 C) (Oral)  Resp 19  Ht 5\' 2"  (1.575 m)  Wt 144 lb (65.318 kg)  BMI 26.33 kg/m2  SpO2 100% Physical Exam  Nursing note and vitals reviewed. Constitutional: She is oriented to person, place, and time. She appears well-developed and well-nourished. No distress.  HENT:  Head: Normocephalic and atraumatic.  Mouth/Throat: Oropharynx is clear and moist.  Mild erythema of the right TM without bulge or significant middle ear effusion. Left TM is slightly erythematous with dullness and bulge.    Mild edema and rhinorrhea present in nasal mucosa.   Eyes: Conjunctivae and EOM are normal. Pupils are equal, round, and reactive to light.  Neck: Normal range of motion. Neck supple.  No meningeal signs  Cardiovascular: Normal rate and regular rhythm.   Pulmonary/Chest: Effort normal and breath sounds normal. No respiratory distress. She has no wheezes. She has no rales.  Abdominal: Soft.  Musculoskeletal: Normal range of motion.  Neurological: She is alert and oriented to person, place, and time.  Skin: Skin is warm and dry. No rash noted.  Psychiatric: She has a normal mood and affect. Her behavior is normal.    ED Course  Procedures       MDM   1. URI (upper respiratory infection)   2. Otalgia of left ear  2:25 AM patient seen and evaluated. Patient appears well in no acute distress. She does not appear severely ill or toxic.   Angus Seller, PA-C 04/13/13 [phone]

## 2014-05-21 ENCOUNTER — Encounter (HOSPITAL_COMMUNITY): Payer: Self-pay | Admitting: Emergency Medicine

## 2014-08-02 ENCOUNTER — Encounter (HOSPITAL_COMMUNITY): Payer: Self-pay | Admitting: Obstetrics & Gynecology

## 2014-12-27 ENCOUNTER — Encounter (HOSPITAL_COMMUNITY): Payer: Self-pay | Admitting: Obstetrics & Gynecology

## 2015-08-07 ENCOUNTER — Emergency Department (HOSPITAL_COMMUNITY): Payer: Medicaid Other

## 2015-08-07 ENCOUNTER — Emergency Department (HOSPITAL_COMMUNITY)
Admission: EM | Admit: 2015-08-07 | Discharge: 2015-08-07 | Disposition: A | Discharge status: Home / Self Care | Payer: Medicaid Other | Attending: Emergency Medicine | Admitting: Emergency Medicine

## 2015-08-07 ENCOUNTER — Encounter (HOSPITAL_COMMUNITY): Payer: Self-pay | Admitting: Emergency Medicine

## 2015-08-07 DIAGNOSIS — R110 Nausea: Secondary | ICD-10-CM | POA: Insufficient documentation

## 2015-08-07 DIAGNOSIS — R1031 Right lower quadrant pain: Secondary | ICD-10-CM | POA: Insufficient documentation

## 2015-08-07 DIAGNOSIS — Z3A01 Less than 8 weeks gestation of pregnancy: Secondary | ICD-10-CM | POA: Insufficient documentation

## 2015-08-07 DIAGNOSIS — O9989 Other specified diseases and conditions complicating pregnancy, childbirth and the puerperium: Secondary | ICD-10-CM | POA: Insufficient documentation

## 2015-08-07 DIAGNOSIS — Z79899 Other long term (current) drug therapy: Secondary | ICD-10-CM | POA: Insufficient documentation

## 2015-08-07 DIAGNOSIS — R1032 Left lower quadrant pain: Secondary | ICD-10-CM | POA: Insufficient documentation

## 2015-08-07 DIAGNOSIS — F17210 Nicotine dependence, cigarettes, uncomplicated: Secondary | ICD-10-CM | POA: Insufficient documentation

## 2015-08-07 DIAGNOSIS — O26899 Other specified pregnancy related conditions, unspecified trimester: Secondary | ICD-10-CM

## 2015-08-07 DIAGNOSIS — O0281 Inappropriate change in quantitative human chorionic gonadotropin (hCG) in early pregnancy: Secondary | ICD-10-CM | POA: Insufficient documentation

## 2015-08-07 DIAGNOSIS — O99331 Smoking (tobacco) complicating pregnancy, first trimester: Secondary | ICD-10-CM | POA: Insufficient documentation

## 2015-08-07 DIAGNOSIS — O280 Abnormal hematological finding on antenatal screening of mother: Secondary | ICD-10-CM

## 2015-08-07 DIAGNOSIS — O209 Hemorrhage in early pregnancy, unspecified: Secondary | ICD-10-CM

## 2015-08-07 LAB — COMPREHENSIVE METABOLIC PANEL
ALT: 14 U/L (ref 14–54)
AST: 19 U/L (ref 15–41)
Albumin: 4.1 g/dL (ref 3.5–5.0)
Alkaline Phosphatase: 52 U/L (ref 38–126)
Anion gap: 10 (ref 5–15)
CHLORIDE: 104 mmol/L (ref 101–111)
CO2: 25 mmol/L (ref 22–32)
CREATININE: 0.81 mg/dL (ref 0.44–1.00)
Calcium: 9.9 mg/dL (ref 8.9–10.3)
GFR calc Af Amer: >60 mL/min (ref >60)
GLUCOSE: 66 mg/dL (ref 65–99)
Potassium: 3.7 mmol/L (ref 3.5–5.1)
SODIUM: 139 mmol/L (ref 135–145)
Total Bilirubin: 1.2 mg/dL (ref 0.3–1.2)
Total Protein: 7.6 g/dL (ref 6.5–8.1)

## 2015-08-07 LAB — URINE MICROSCOPIC-ADD ON

## 2015-08-07 LAB — URINALYSIS, ROUTINE W REFLEX MICROSCOPIC
Bilirubin Urine: NEGATIVE
GLUCOSE, UA: NEGATIVE mg/dL
KETONES UR: NEGATIVE mg/dL
Leukocytes, UA: NEGATIVE
Nitrite: NEGATIVE
PROTEIN: NEGATIVE mg/dL
Specific Gravity, Urine: 1.005 (ref 1.005–1.030)
pH: 7 (ref 5.0–8.0)

## 2015-08-07 LAB — CBC
HCT: 38.4 % (ref 36.0–46.0)
Hemoglobin: 12.6 g/dL (ref 12.0–15.0)
MCH: 28.9 pg (ref 26.0–34.0)
MCHC: 32.8 g/dL (ref 30.0–36.0)
MCV: 88.1 fL (ref 78.0–100.0)
PLATELETS: 261 10*3/uL (ref 150–400)
RBC: 4.36 MIL/uL (ref 3.87–5.11)
RDW: 12.6 % (ref 11.5–15.5)
WBC: 8 10*3/uL (ref 4.0–10.5)

## 2015-08-07 LAB — LIPASE, BLOOD: LIPASE: 24 U/L (ref 11–51)

## 2015-08-07 LAB — HCG, QUANTITATIVE, PREGNANCY: hCG, Beta Chain, Quant, S: 125 m[IU]/mL — ABNORMAL HIGH (ref <5)

## 2015-08-07 NOTE — Discharge Instructions (Signed)
There was no evidence of pregnancy in your uterus.  It could be a miscarriage or very early.  It is important to have hcg levels followed by your doctor and perhaps a repeat ultrasound.   Human Chorionic Gonadotropin Test Human chorionic gonadotropin (hCG) is a hormone produced during pregnancy by the cells that form the placenta. The placenta is the organ that grows inside your womb (uterus) to nourish a developing baby. When you are pregnant, hCG starts to appear in your blood about 11 days after conception. It continues to go up for the first 8-11 weeks of pregnancy.  Your hCG level can be measured with several different types of tests. You may have:  A urine test.  hCG is eliminated from your body by your kidneys, so a urine test is one way to check for this hormone.  A urine test only shows whether there is hCG in your urine. It does not measure how much.  You may have a urine test to find out whether you are pregnant.  A home pregnancy test detects whether there is hCG in your urine.  A qualitative blood test.  Like the urine test, this blood test only shows whether there is hCG in your blood. It does not measure how much.  You may have this type of blood test to find out whether you are pregnant.  A quantitative blood test.  This type of blood test measures the amount of hCG in your blood.  You may have this type of test to diagnose an abnormal pregnancy or determine whether you are at risk of, or have had, a failed pregnancy (miscarriage). PREPARATION FOR TEST For the urine test:  Limit your fluid intake before the urine test as directed by your health care provider.  Collect the sample the first time you urinate in the morning.  Let your health care provider know if you have blood in your urine. This may interfere with the test result. Some medicines may interfere with the urine and blood tests. Let your health care provider know about all the medicines you are taking. No  additional preparation is required for the blood test.  RESULTS It is your responsibility to obtain your test results. Ask the lab or department performing the test when and how you will get your results. Talk to your health care provider if you have any questions about your test results. The results of the hCG urine test and the qualitative hCG blood test are either positive or negative. The results of the quantitative hCG blood test are reported as a number. hCG is measured in international units per liter (IU/L). Meaning of Negative Test Results A negative result on a urine or qualitative blood test could mean that you are not pregnant. It could also mean the test was done too early to detect hCG. If you still have other signs of pregnancy, the test should be repeated. Meaning of Positive Test Results A positive result on the urine or qualitative blood tests means you are most likely pregnant. Your health care provider may confirm your pregnancy with an imaging study of the inside of your uterus at 5-6 weeks (ultrasound).  Range of Normal Values Ranges for normal values for the quantitative hCG blood test may vary among different labs and hospitals. You should always check with your health care provider after having lab work or other tests done to discuss whether your values are considered within normal limits.   Less than 5 IU/L means it  is most likely you are not pregnant.  Greater than 25 IU/L means it is most likely you are pregnant. Meaning of Results Outside Normal Value Ranges If your hCG level on the quantitative test is not what would be expected, you may have the test again. It may also be important for your health care provider to know whether your hCG level goes up or down over time. Common causes of results outside the normal range include:   Being pregnant with twins (hCG level is higher than expected).  Having an ectopic pregnancy (hCG rises more slowly than  expected).  Miscarriage (hCG level falls).  Abnormal growths in the womb (hCG level is higher than expected).   This information is not intended to replace advice given to you by your health care provider. Make sure you discuss any questions you have with your health care provider.   Document Released: 08/07/2004 Document Revised: 07/27/2014 Document Reviewed: 10/10/2013 Elsevier Interactive Patient Education [employer]

## 2015-08-07 NOTE — ED Provider Notes (Signed)
CSN: [id number]     Arrival date & time 08/07/15  1022 History   First MD Initiated Contact with Patient 08/07/15 1051     Chief Complaint  Patient presents with  . Abdominal Pain  . Vaginal Bleeding     (Consider location/radiation/quality/duration/timing/severity/associated sxs/prior Treatment) HPI Comments: Kelly Gordon is a 25 y.o. F [id number] currently at [redacted]w[redacted]d gestation by LMP (07/02/15) presenting to ED for evaluation of abd pain and vaginal bleeding.  Patient reports previous SAB around 5 weeks about 6 years ago.  Reports she has had mild abdominal cramping intermittently in bilateral lower quadrants for ~2 weeks.  This feels like cramping she had early in previous pregnancies.  She became concerned today when she started have vaginal bleeding.  Bleeding is bright red and similar volume to menstrual period.  She also endorses mild nausea intermittently.  Has had seen OB yet and was followed at [facility] for previous pregnancies.  Patient is a 25 y.o. female presenting with abdominal pain and vaginal bleeding. The history is provided by the patient.  Abdominal Pain Pain location:  Suprapubic Pain radiates to:  Does not radiate Pain severity:  Mild Duration:  2 weeks Timing:  Intermittent Associated symptoms: nausea and vaginal bleeding   Associated symptoms: no chills, no constipation, no diarrhea, no fever, no vaginal discharge and no vomiting   Vaginal bleeding:    Quality:  Bright red Vaginal Bleeding Associated symptoms: abdominal pain and nausea   Associated symptoms: no fever and no vaginal discharge     Past Medical History  Diagnosis Date  . No pertinent past medical history    Past Surgical History  Procedure Laterality Date  . No past surgeries     No family history on file. Social History  Substance Use Topics  . Smoking status: Current Every Day Smoker -- 0.10 packs/day    Types: Cigarettes  . Smokeless tobacco: None  . Alcohol Use: Yes     Comment: socially    OB History    Gravida Para Term Preterm AB TAB SAB Ectopic Multiple Living   Review of Systems  Constitutional: Negative for fever and chills.  Gastrointestinal: Positive for nausea and abdominal pain. Negative for vomiting, diarrhea and constipation.  Genitourinary: Positive for vaginal bleeding. Negative for vaginal discharge.  Skin: Negative for pallor.  All other systems reviewed and are negative.     Allergies  Review of patient's allergies indicates no known allergies.  Home Medications   Prior to Admission medications   Medication Sig Start Date End Date Taking? Authorizing Provider  cetirizine-pseudoephedrine (ZYRTEC-D) 5-120 MG per tablet Take 1 tablet by mouth daily. 04/13/13   Peter Dammen, PA-C   BP 108/54 mmHg  Pulse 55  Resp 20  SpO2 96% Physical Exam  Constitutional: She is oriented to person, place, and time. She appears well-developed and well-nourished. No distress.  HENT:  Head: Normocephalic and atraumatic.  Eyes: Conjunctivae are normal. No scleral icterus.  Neck: Neck supple.  Cardiovascular: Normal rate, regular rhythm, normal heart sounds and intact distal pulses.   No murmur heard. Pulmonary/Chest: Effort normal and breath sounds normal. No respiratory distress. She has no wheezes. She has no rales.  Abdominal: Soft. Bowel sounds are normal. She exhibits no distension. There is no rebound and no guarding.  Mildly TTP suprapubically  Musculoskeletal: She exhibits no edema or tenderness.  Lymphadenopathy:    She has no cervical  adenopathy.  Neurological: She is alert and oriented to person, place, and time.  Skin: Skin is warm and dry. No rash noted.  Psychiatric: She has a normal mood and affect. Her behavior is normal.    ED Course  Procedures (including critical care time) Labs Review Labs Reviewed  COMPREHENSIVE METABOLIC PANEL - Abnormal; Notable for the following:    BUN <5 (*)    All other components within  normal limits  URINALYSIS, ROUTINE W REFLEX MICROSCOPIC (NOT AT [facility]) - Abnormal; Notable for the following:    Hgb urine dipstick LARGE (*)    All other components within normal limits  HCG, QUANTITATIVE, PREGNANCY - Abnormal; Notable for the following:    hCG, Beta Chain, Quant, S 125 (*)    All other components within normal limits  URINE MICROSCOPIC-ADD ON - Abnormal; Notable for the following:    Squamous Epithelial / LPF 0-5 (*)    Bacteria, UA RARE (*)    All other components within normal limits  LIPASE, BLOOD  CBC    Imaging Review US Ob Comp Less 14 Wks  08/07/2015  CLINICAL DATA:  Vaginal bleeding with positive urine pregnancy test. Quantitative beta HCG of 125. EXAM: OBSTETRIC <14 WK [place] AND TRANSVAGINAL OB US TECHNIQUE: Both transabdominal and transvaginal ultrasound examinations were performed for complete evaluation of the gestation as well as the maternal uterus, adnexal regions, and pelvic cul-de-sac. Transvaginal technique was performed to assess early pregnancy. COMPARISON:  None. FINDINGS: Intrauterine gestational sac: Not seen. Endometrium normal in thickness at 7 mm. Yolk sac:  Not seen Embryo:  Not seen Cardiac Activity: Not seen Subchorionic hemorrhage:  None visualized. Maternal uterus/adnexae: Small complex cyst versus collapsed follicle within the right ovary measuring 1.4 x 1 cm. Bilateral ovaries otherwise unremarkable without significant mass or cyst. No mass or free fluid within either adnexal region. No mass or free fluid in the cul-de-sac. IMPRESSION: Uterus and ovaries are unremarkable. No intrauterine pregnancy identified. No evidence of ectopic pregnancy. No mass or free fluid within the pelvis. Recommend follow-up with serial beta HCG levels. Electronically Signed   By: Bary Richard M.D.   On: 08/07/2015 14:31   US Ob Transvaginal  08/07/2015  CLINICAL DATA:  Vaginal bleeding with positive urine pregnancy test. Quantitative beta HCG of 125. EXAM: OBSTETRIC <14  WK [place] AND TRANSVAGINAL OB US TECHNIQUE: Both transabdominal and transvaginal ultrasound examinations were performed for complete evaluation of the gestation as well as the maternal uterus, adnexal regions, and pelvic cul-de-sac. Transvaginal technique was performed to assess early pregnancy. COMPARISON:  None. FINDINGS: Intrauterine gestational sac: Not seen. Endometrium normal in thickness at 7 mm. Yolk sac:  Not seen Embryo:  Not seen Cardiac Activity: Not seen Subchorionic hemorrhage:  None visualized. Maternal uterus/adnexae: Small complex cyst versus collapsed follicle within the right ovary measuring 1.4 x 1 cm. Bilateral ovaries otherwise unremarkable without significant mass or cyst. No mass or free fluid within either adnexal region. No mass or free fluid in the cul-de-sac. IMPRESSION: Uterus and ovaries are unremarkable. No intrauterine pregnancy identified. No evidence of ectopic pregnancy. No mass or free fluid within the pelvis. Recommend follow-up with serial beta HCG levels. Electronically Signed   By: Bary Richard M.D.   On: 08/07/2015 14:31   I have personally reviewed and evaluated these images and lab results as part of my medical decision-making.   EKG Interpretation None      MDM   Final diagnoses:  Vaginal bleeding before [redacted] weeks gestation  Low maternal serum human chorionic gonadotropin (hCG)    25 y.o. F presenting with positive home pregnancy test and vaginal bleeding. LMP 07/02/15.  Patient stable without anemia.  OB US shows no yolk sac or other evidence of IUP, also no evidence of ectopic pregnancy.  Hcg quant 125.  Suspect early missed SAB.  Advised patient to follow-up with health department and have serial Hcg levels and repeat ultrasound in a few weeks.  Patient stable for discharge and agreeable to plan.  Discussed return precautions.    Erasmo Downer, MD 08/07/15 1450  Blane Ohara, MD 08/07/15 [phone]

## 2015-08-07 NOTE — ED Notes (Signed)
Patient states she found out she was pregnant on Saturday (estimates 5 wks), and starting this morning she is having abdominal pain/cramping with moderate vaginal bleeding.  Denies other symptoms.

## 2015-11-13 ENCOUNTER — Encounter (HOSPITAL_COMMUNITY): Payer: Self-pay | Admitting: Family Medicine

## 2015-11-13 ENCOUNTER — Emergency Department (HOSPITAL_COMMUNITY): Payer: Medicaid Other

## 2015-11-13 ENCOUNTER — Emergency Department (HOSPITAL_COMMUNITY)
Admission: EM | Admit: 2015-11-13 | Discharge: 2015-11-13 | Disposition: A | Discharge status: Home / Self Care | Payer: Medicaid Other | Attending: Emergency Medicine | Admitting: Emergency Medicine

## 2015-11-13 DIAGNOSIS — N760 Acute vaginitis: Secondary | ICD-10-CM

## 2015-11-13 DIAGNOSIS — Z3A01 Less than 8 weeks gestation of pregnancy: Secondary | ICD-10-CM | POA: Insufficient documentation

## 2015-11-13 DIAGNOSIS — F17210 Nicotine dependence, cigarettes, uncomplicated: Secondary | ICD-10-CM | POA: Diagnosis not present

## 2015-11-13 DIAGNOSIS — Z79899 Other long term (current) drug therapy: Secondary | ICD-10-CM | POA: Insufficient documentation

## 2015-11-13 DIAGNOSIS — O99331 Smoking (tobacco) complicating pregnancy, first trimester: Secondary | ICD-10-CM | POA: Diagnosis not present

## 2015-11-13 DIAGNOSIS — B9689 Other specified bacterial agents as the cause of diseases classified elsewhere: Secondary | ICD-10-CM

## 2015-11-13 DIAGNOSIS — O200 Threatened abortion: Secondary | ICD-10-CM | POA: Insufficient documentation

## 2015-11-13 DIAGNOSIS — O209 Hemorrhage in early pregnancy, unspecified: Secondary | ICD-10-CM | POA: Diagnosis present

## 2015-11-13 DIAGNOSIS — O23591 Infection of other part of genital tract in pregnancy, first trimester: Secondary | ICD-10-CM | POA: Diagnosis not present

## 2015-11-13 DIAGNOSIS — O4690 Antepartum hemorrhage, unspecified, unspecified trimester: Secondary | ICD-10-CM

## 2015-11-13 LAB — CBC WITH DIFFERENTIAL/PLATELET
BASOS ABS: 0 10*3/uL (ref 0.0–0.1)
Basophils Relative: 1 %
EOS ABS: 0.1 10*3/uL (ref 0.0–0.7)
EOS PCT: 1 %
HCT: 35.5 % — ABNORMAL LOW (ref 36.0–46.0)
HEMOGLOBIN: 11.5 g/dL — AB (ref 12.0–15.0)
Lymphocytes Relative: 30 %
Lymphs Abs: 2.5 10*3/uL (ref 0.7–4.0)
MCH: 27.5 pg (ref 26.0–34.0)
MCHC: 32.4 g/dL (ref 30.0–36.0)
MCV: 84.9 fL (ref 78.0–100.0)
Monocytes Absolute: 1 10*3/uL (ref 0.1–1.0)
Monocytes Relative: 12 %
NEUTROS PCT: 56 %
Neutro Abs: 4.6 10*3/uL (ref 1.7–7.7)
PLATELETS: 283 10*3/uL (ref 150–400)
RBC: 4.18 MIL/uL (ref 3.87–5.11)
RDW: 12.5 % (ref 11.5–15.5)
WBC: 8.2 10*3/uL (ref 4.0–10.5)

## 2015-11-13 LAB — WET PREP, GENITAL
SPERM: NONE SEEN
TRICH WET PREP: NONE SEEN
YEAST WET PREP: NONE SEEN

## 2015-11-13 LAB — BASIC METABOLIC PANEL
ANION GAP: 7 (ref 5–15)
BUN: <5 mg/dL — ABNORMAL LOW (ref 6–20)
CHLORIDE: 103 mmol/L (ref 101–111)
CO2: 26 mmol/L (ref 22–32)
Calcium: 9.7 mg/dL (ref 8.9–10.3)
Creatinine, Ser: 0.76 mg/dL (ref 0.44–1.00)
Glucose, Bld: 81 mg/dL (ref 65–99)
POTASSIUM: 3.9 mmol/L (ref 3.5–5.1)
SODIUM: 136 mmol/L (ref 135–145)

## 2015-11-13 LAB — HCG, QUANTITATIVE, PREGNANCY: HCG, BETA CHAIN, QUANT, S: 14799 m[IU]/mL — AB (ref <5)

## 2015-11-13 LAB — I-STAT BETA HCG BLOOD, ED (MC, WL, AP ONLY)

## 2015-11-13 MED ORDER — METRONIDAZOLE 500 MG PO TABS: 500.0000 mg | ORAL_TABLET | Freq: Two times a day (BID) | ORAL | Status: DC

## 2015-11-13 NOTE — ED Notes (Signed)
Pt sts that she is 5 weeks and 5 days pregnant. LMP march 17th. sts started bleeding today. sts heavier than spotting.

## 2015-11-13 NOTE — Discharge Instructions (Signed)

## 2015-11-13 NOTE — ED Notes (Signed)
Pt in US

## 2015-11-13 NOTE — ED Notes (Signed)
Patient states she has preg. Test at health dept. And is 5 weeks preg. Started with vag. Bleeding  Bright red however she is wearing a pad not satuated

## 2015-11-13 NOTE — ED Provider Notes (Signed)
CSN: [id number]     Arrival date & time 11/13/15  1218 History  By signing my name below, I, Kelly Gordon, attest that this documentation has been prepared under the direction and in the presence of [employer], [place]. Electronically Signed: Ronney Gordon, ED Scribe. 11/13/2015. 2:48 PM.    Chief Complaint  Patient presents with  . Vaginal Bleeding   The history is provided by the patient. No language interpreter was used.   HPI Comments: AMBUR PROVINCE is a 25 y.o. female who is 5 weeks and 5 days pregnant (G5P2A2) who presents to the Emergency Department complaining of constant, light, bright red vaginal bleeding that began today. She notes suprapubic abdominal pain and nausea. Patient reports she has been using pads, although her pad is not saturated. Patient states she does not have a OB/GYN but goes to the health department for obstetric and gynecological care. She denies dysuria.  Past Medical History  Diagnosis Date  . No pertinent past medical history    Past Surgical History  Procedure Laterality Date  . No past surgeries     History reviewed. No pertinent family history. Social History  Substance Use Topics  . Smoking status: Current Every Day Smoker -- 0.10 packs/day    Types: Cigarettes  . Smokeless tobacco: None  . Alcohol Use: Yes     Comment: socially   OB History    Gravida Para Term Preterm AB TAB SAB Ectopic Multiple Living   Review of Systems  Genitourinary: Positive for vaginal bleeding. Negative for dysuria.  All other systems reviewed and are negative.     Allergies  Review of patient's allergies indicates no known allergies.  Home Medications   Prior to Admission medications   Medication Sig Start Date End Date Taking? Authorizing Provider  cetirizine-pseudoephedrine (ZYRTEC-D) 5-120 MG per tablet Take 1 tablet by mouth daily. 04/13/13   Peter Dammen, PA-C   BP 107/63 mmHg  Pulse 76  Temp(Src) 98.1 F (36.7 C) (Oral)  Resp  16  SpO2 100%  LMP 10/04/2015 Physical Exam  Constitutional: She is oriented to person, place, and time. She appears well-developed and well-nourished.  HENT:  Head: Normocephalic and atraumatic.  Eyes: Conjunctivae and EOM are normal. Pupils are equal, round, and reactive to light.  Neck: Normal range of motion. Neck supple.  Cardiovascular: Normal rate and regular rhythm.  Exam reveals no gallop and no friction rub.   No murmur heard. Pulmonary/Chest: Effort normal and breath sounds normal. No respiratory distress. She has no wheezes. She has no rales. She exhibits no tenderness.  Abdominal: Soft. Bowel sounds are normal. She exhibits no distension and no mass. There is no tenderness. There is no rebound and no guarding.  No focal abdominal tenderness, no RLQ tenderness or pain at McBurney's point, no RUQ tenderness or Murphy's sign, no left-sided abdominal tenderness, no fluid wave, or signs of peritonitis   Genitourinary:  Pelvic exam chaperoned by female ER tech, no right or left adnexal tenderness, mild uterine tenderness, no vaginal discharge, scant bleeding, OS is closed, no CMT or friability, no foreign body, no injury to the external genitalia, no other significant findings   Musculoskeletal: Normal range of motion. She exhibits no edema or tenderness.  Neurological: She is alert and oriented to person, place, and time.  Skin: Skin is warm and dry.  Psychiatric: She has a normal mood and affect. Her behavior is normal.  Judgment and thought content normal.  Nursing note and vitals reviewed.   ED Course  Procedures (including critical care time)  DIAGNOSTIC STUDIES: Oxygen Saturation is 100% on RA, normal by my interpretation.    COORDINATION OF CARE: 2:44 PM - Discussed treatment plan with pt at bedside which includes pelvic exam. Advised pt to follow up with OB/GYN and repeat blood test in 48 hours. Pt verbalized understanding and agreed to plan.   Results for orders  placed or performed during the hospital encounter of 11/13/15  Wet prep, genital  Result Value Ref Range   Yeast Wet Prep HPF POC NONE SEEN NONE SEEN   Trich, Wet Prep NONE SEEN NONE SEEN   Clue Cells Wet Prep HPF POC PRESENT (A) NONE SEEN   WBC, Wet Prep HPF POC FEW (A) NONE SEEN   Sperm NONE SEEN   hCG, quantitative, pregnancy  Result Value Ref Range   hCG, Beta Chain, Quant, S 14799 (H) <5 mIU/mL  CBC with Differential/Platelet  Result Value Ref Range   WBC 8.2 4.0 - 10.5 K/uL   RBC 4.18 3.87 - 5.11 MIL/uL   Hemoglobin 11.5 (L) 12.0 - 15.0 g/dL   HCT 08.635.5 (L) 57.836.0 - 46.946.0 %   MCV 84.9 78.0 - 100.0 fL   MCH 27.5 26.0 - 34.0 pg   MCHC 32.4 30.0 - 36.0 g/dL   RDW 62.912.5 52.811.5 - 41.315.5 %   Platelets 283 150 - 400 K/uL   Neutrophils Relative % 56 %   Neutro Abs 4.6 1.7 - 7.7 K/uL   Lymphocytes Relative 30 %   Lymphs Abs 2.5 0.7 - 4.0 K/uL   Monocytes Relative 12 %   Monocytes Absolute 1.0 0.1 - 1.0 K/uL   Eosinophils Relative 1 %   Eosinophils Absolute 0.1 0.0 - 0.7 K/uL   Basophils Relative 1 %   Basophils Absolute 0.0 0.0 - 0.1 K/uL  Basic metabolic panel  Result Value Ref Range   Sodium 136 135 - 145 mmol/L   Potassium 3.9 3.5 - 5.1 mmol/L   Chloride 103 101 - 111 mmol/L   CO2 26 22 - 32 mmol/L   Glucose, Bld 81 65 - 99 mg/dL   BUN <5 (L) 6 - 20 mg/dL   Creatinine, Ser 2.440.76 0.44 - 1.00 mg/dL   Calcium 9.7 8.9 - 01.010.3 mg/dL   GFR calc non Af Amer >60 >60 mL/min   GFR calc Af Amer >60 >60 mL/min   Anion gap 7 5 - 15  I-Stat beta hCG blood, ED ([facility], [facility], [facility] only)  Result Value Ref Range   I-stat hCG, quantitative >2000.0 (H) <5 mIU/mL   Comment 3           [place] Ob Comp Less 14 Wks  11/13/2015  CLINICAL DATA:  Vaginal bleeding EXAM: OBSTETRIC <14 WK US AND TRANSVAGINAL OB US TECHNIQUE: Both transabdominal and transvaginal ultrasound examinations were performed for complete evaluation of the gestation as well as the maternal uterus, adnexal regions, and pelvic cul-de-sac.  Transvaginal technique was performed to assess early pregnancy. COMPARISON:  None. FINDINGS: Intrauterine gestational sac: Present Yolk sac:  Present Embryo:  Not present Cardiac Activity: Not present MSD: 9.2  mm   5 w   5  d Subchorionic hemorrhage:  Small subchorionic hemorrhage. Maternal uterus/adnexae: Neither ovary is visualized. No adnexal mass. Normal size uterus measuring 8.1 x 5.9 x 6.5 cm. Trace pelvic free fluid likely physiologic. IMPRESSION: 1. Probable early intrauterine gestational sac, but no fetal  pole or cardiac activity yet visualized. Recommend follow-up quantitative B-HCG levels and follow-up US in 14 days to confirm and assess viability. This recommendation follows SRU consensus guidelines: Diagnostic Criteria for Nonviable Pregnancy Early in the First Trimester. Malva Limes Med 2013; 161:0960-45. 2. Small subchorionic hemorrhage. Electronically Signed   By: Elige Ko   On: 11/13/2015 18:44   US Ob Transvaginal  11/13/2015  CLINICAL DATA:  Vaginal bleeding EXAM: OBSTETRIC <14 WK [place] AND TRANSVAGINAL OB US TECHNIQUE: Both transabdominal and transvaginal ultrasound examinations were performed for complete evaluation of the gestation as well as the maternal uterus, adnexal regions, and pelvic cul-de-sac. Transvaginal technique was performed to assess early pregnancy. COMPARISON:  None. FINDINGS: Intrauterine gestational sac: Present Yolk sac:  Present Embryo:  Not present Cardiac Activity: Not present MSD: 9.2  mm   5 w   5  d Subchorionic hemorrhage:  Small subchorionic hemorrhage. Maternal uterus/adnexae: Neither ovary is visualized. No adnexal mass. Normal size uterus measuring 8.1 x 5.9 x 6.5 cm. Trace pelvic free fluid likely physiologic. IMPRESSION: 1. Probable early intrauterine gestational sac, but no fetal pole or cardiac activity yet visualized. Recommend follow-up quantitative B-HCG levels and follow-up US in 14 days to confirm and assess viability. This recommendation follows SRU  consensus guidelines: Diagnostic Criteria for Nonviable Pregnancy Early in the First Trimester. Malva Limes Med 2013; 409:8119-14. 2. Small subchorionic hemorrhage. Electronically Signed   By: Elige Ko   On: 11/13/2015 18:44    I have personally reviewed and evaluated these images and lab results as part of my medical decision-making.    MDM   Final diagnoses:  Vaginal bleeding in pregnancy, first trimester  Threatened miscarriage  BV (bacterial vaginosis)   Patient with vaginal bleeding and positive pregnancy test. She is [redacted] weeks pregnant. Prior lab tests reviewed, patient is O+, no need to give RhoGAM. Patient will need to follow-up at [facility] clinic in 2 days for repeat hCG.   Patient discussed with Dr. Effie Shy, who recommends [place] to check location.  [place] as above.  Will require follow-up in 2 days for HCG and [place] in 2 weeks.  I personally performed the services described in this documentation, which was scribed in my presence. The recorded information has been reviewed and is accurate.       Roxy Horseman, PA-C 11/13/15 [id number]  Mancel Bale, MD 11/15/15 [phone]

## 2015-11-14 LAB — GC/CHLAMYDIA PROBE AMP (~~LOC~~) NOT AT ARMC
CHLAMYDIA, DNA PROBE: NEGATIVE
NEISSERIA GONORRHEA: NEGATIVE

## 2015-11-15 ENCOUNTER — Other Ambulatory Visit: Payer: Medicaid Other

## 2015-11-15 DIAGNOSIS — O3680X0 Pregnancy with inconclusive fetal viability, not applicable or unspecified: Secondary | ICD-10-CM

## 2015-11-15 LAB — HCG, QUANTITATIVE, PREGNANCY: HCG, BETA CHAIN, QUANT, S: 25259 m[IU]/mL — AB

## 2015-11-18 ENCOUNTER — Telehealth: Payer: Self-pay

## 2015-11-18 DIAGNOSIS — Z3200 Encounter for pregnancy test, result unknown: Secondary | ICD-10-CM

## 2015-11-18 NOTE — Telephone Encounter (Signed)
Pt returned call and I informed her that f/u US scheduled for May 10th @ 1100 am and I also gave pt results of beta.  Pt stated understanding with no further questions.

## 2015-11-18 NOTE — Telephone Encounter (Signed)
Needs follow up ultrasound 7-10 days after the previous scan for viability check. Please schedule and call patient with results and recommendations   I have attempted to call patient x 2 at home number listed and cell with no answer.

## 2015-11-27 ENCOUNTER — Other Ambulatory Visit: Payer: Self-pay | Admitting: Obstetrics and Gynecology

## 2015-11-27 ENCOUNTER — Ambulatory Visit (HOSPITAL_COMMUNITY)
Admission: RE | Admit: 2015-11-27 | Discharge: 2015-11-27 | Disposition: A | Discharge status: Home / Self Care | Payer: Medicaid Other | Source: Ambulatory Visit | Attending: Obstetrics and Gynecology | Admitting: Obstetrics and Gynecology

## 2015-11-27 ENCOUNTER — Telehealth: Payer: Self-pay

## 2015-11-27 DIAGNOSIS — Z3A01 Less than 8 weeks gestation of pregnancy: Secondary | ICD-10-CM | POA: Diagnosis not present

## 2015-11-27 DIAGNOSIS — Z3200 Encounter for pregnancy test, result unknown: Secondary | ICD-10-CM

## 2015-11-27 DIAGNOSIS — Z36 Encounter for antenatal screening of mother: Secondary | ICD-10-CM | POA: Diagnosis not present

## 2015-11-27 NOTE — Telephone Encounter (Signed)
Dr. Jolayne Pantheronstant pt has viable pregnancy and needs to start prenatal care. Called pt and LM to return call to the Clinics.

## 2015-12-02 NOTE — Telephone Encounter (Signed)
Pt plans to follow up at the health department.

## 2015-12-23 ENCOUNTER — Other Ambulatory Visit (HOSPITAL_COMMUNITY): Payer: Self-pay | Admitting: Nurse Practitioner

## 2015-12-23 DIAGNOSIS — Z3A13 13 weeks gestation of pregnancy: Secondary | ICD-10-CM

## 2015-12-23 DIAGNOSIS — Z3682 Encounter for antenatal screening for nuchal translucency: Secondary | ICD-10-CM

## 2015-12-23 LAB — DRUG SCREEN, URINE: Cocaine & Metabolite, IA: POSITIVE

## 2015-12-23 LAB — GLUCOSE TOLERANCE, 1 HOUR (50G) W/O FASTING: Glucose, GTT - 1 Hour: 97 mg/dL (ref ?–200)

## 2015-12-27 ENCOUNTER — Encounter (HOSPITAL_COMMUNITY): Payer: Self-pay

## 2015-12-30 ENCOUNTER — Encounter (HOSPITAL_COMMUNITY): Payer: Self-pay

## 2015-12-30 ENCOUNTER — Ambulatory Visit (HOSPITAL_COMMUNITY)
Admission: RE | Admit: 2015-12-30 | Discharge: 2015-12-30 | Disposition: A | Discharge status: Home / Self Care | Payer: Medicaid Other | Source: Ambulatory Visit | Attending: Nurse Practitioner | Admitting: Nurse Practitioner

## 2015-12-30 DIAGNOSIS — O09212 Supervision of pregnancy with history of pre-term labor, second trimester: Secondary | ICD-10-CM | POA: Diagnosis present

## 2015-12-30 DIAGNOSIS — Z3A12 12 weeks gestation of pregnancy: Secondary | ICD-10-CM | POA: Diagnosis not present

## 2015-12-30 DIAGNOSIS — Z3A13 13 weeks gestation of pregnancy: Secondary | ICD-10-CM

## 2015-12-30 DIAGNOSIS — Z36 Encounter for antenatal screening of mother: Secondary | ICD-10-CM | POA: Diagnosis not present

## 2015-12-30 DIAGNOSIS — Z3682 Encounter for antenatal screening for nuchal translucency: Secondary | ICD-10-CM

## 2016-01-02 LAB — OB RESULTS CONSOLE HGB/HCT, BLOOD
HCT: 32 %
Hemoglobin: 11 g/dL

## 2016-01-02 LAB — OB RESULTS CONSOLE RPR: RPR: NONREACTIVE

## 2016-01-02 LAB — OB RESULTS CONSOLE GC/CHLAMYDIA
Chlamydia: NEGATIVE
Gonorrhea: NEGATIVE

## 2016-01-02 LAB — OB RESULTS CONSOLE HIV ANTIBODY (ROUTINE TESTING): HIV: NONREACTIVE

## 2016-01-02 LAB — OB RESULTS CONSOLE ANTIBODY SCREEN: Antibody Screen: NEGATIVE

## 2016-01-02 LAB — OB RESULTS CONSOLE ABO/RH: RH TYPE: POSITIVE

## 2016-01-02 LAB — OB RESULTS CONSOLE PLATELET COUNT: PLATELETS: 266 10*3/uL

## 2016-01-02 LAB — OB RESULTS CONSOLE RUBELLA ANTIBODY, IGM: Rubella: IMMUNE

## 2016-01-06 ENCOUNTER — Other Ambulatory Visit (HOSPITAL_COMMUNITY): Payer: Self-pay

## 2016-01-20 ENCOUNTER — Other Ambulatory Visit (HOSPITAL_COMMUNITY): Payer: Self-pay | Admitting: Nurse Practitioner

## 2016-01-20 DIAGNOSIS — Z3689 Encounter for other specified antenatal screening: Secondary | ICD-10-CM

## 2016-02-10 ENCOUNTER — Ambulatory Visit (HOSPITAL_COMMUNITY)
Admission: RE | Admit: 2016-02-10 | Discharge: 2016-02-10 | Disposition: A | Discharge status: Home / Self Care | Payer: Medicaid Other | Source: Ambulatory Visit | Attending: Nurse Practitioner | Admitting: Nurse Practitioner

## 2016-02-10 ENCOUNTER — Other Ambulatory Visit (HOSPITAL_COMMUNITY): Payer: Self-pay | Admitting: Nurse Practitioner

## 2016-02-10 ENCOUNTER — Encounter (HOSPITAL_COMMUNITY): Payer: Self-pay

## 2016-02-10 DIAGNOSIS — O09892 Supervision of other high risk pregnancies, second trimester: Secondary | ICD-10-CM

## 2016-02-10 DIAGNOSIS — O34219 Maternal care for unspecified type scar from previous cesarean delivery: Secondary | ICD-10-CM

## 2016-02-10 DIAGNOSIS — O09212 Supervision of pregnancy with history of pre-term labor, second trimester: Secondary | ICD-10-CM | POA: Insufficient documentation

## 2016-02-10 DIAGNOSIS — O99332 Smoking (tobacco) complicating pregnancy, second trimester: Secondary | ICD-10-CM | POA: Diagnosis present

## 2016-02-10 DIAGNOSIS — Z3689 Encounter for other specified antenatal screening: Secondary | ICD-10-CM

## 2016-02-10 DIAGNOSIS — Z3A18 18 weeks gestation of pregnancy: Secondary | ICD-10-CM | POA: Insufficient documentation

## 2016-02-10 DIAGNOSIS — O99330 Smoking (tobacco) complicating pregnancy, unspecified trimester: Secondary | ICD-10-CM

## 2016-02-10 DIAGNOSIS — Z36 Encounter for antenatal screening of mother: Secondary | ICD-10-CM | POA: Diagnosis not present

## 2016-02-11 ENCOUNTER — Other Ambulatory Visit (HOSPITAL_COMMUNITY): Payer: Self-pay

## 2016-02-11 DIAGNOSIS — O09219 Supervision of pregnancy with history of pre-term labor, unspecified trimester: Principal | ICD-10-CM

## 2016-02-11 DIAGNOSIS — O09899 Supervision of other high risk pregnancies, unspecified trimester: Secondary | ICD-10-CM

## 2016-02-12 ENCOUNTER — Encounter: Payer: Medicaid Other | Admitting: Family

## 2016-02-17 ENCOUNTER — Encounter: Payer: Self-pay | Admitting: *Deleted

## 2016-02-18 ENCOUNTER — Encounter: Payer: Self-pay | Admitting: *Deleted

## 2016-02-18 LAB — CYTOLOGY - PAP: Pap Smear: NORMAL

## 2016-02-20 ENCOUNTER — Encounter: Payer: Self-pay | Admitting: Obstetrics & Gynecology

## 2016-02-20 ENCOUNTER — Ambulatory Visit (INDEPENDENT_AMBULATORY_CARE_PROVIDER_SITE_OTHER): Payer: Medicaid Other | Admitting: Obstetrics & Gynecology

## 2016-02-20 DIAGNOSIS — O0992 Supervision of high risk pregnancy, unspecified, second trimester: Secondary | ICD-10-CM | POA: Diagnosis present

## 2016-02-20 DIAGNOSIS — Z8619 Personal history of other infectious and parasitic diseases: Secondary | ICD-10-CM

## 2016-02-20 DIAGNOSIS — Z8751 Personal history of pre-term labor: Secondary | ICD-10-CM | POA: Diagnosis not present

## 2016-02-20 DIAGNOSIS — O0990 Supervision of high risk pregnancy, unspecified, unspecified trimester: Secondary | ICD-10-CM | POA: Insufficient documentation

## 2016-02-20 LAB — POCT URINALYSIS DIP (DEVICE)
Bilirubin Urine: NEGATIVE
GLUCOSE, UA: NEGATIVE mg/dL
HGB URINE DIPSTICK: NEGATIVE
Ketones, ur: NEGATIVE mg/dL
Leukocytes, UA: NEGATIVE
Nitrite: NEGATIVE
PH: 6 (ref 5.0–8.0)
PROTEIN: NEGATIVE mg/dL
SPECIFIC GRAVITY, URINE: 1.025 (ref 1.005–1.030)
UROBILINOGEN UA: 1 mg/dL (ref 0.0–1.0)

## 2016-02-20 NOTE — Patient Instructions (Signed)

## 2016-02-20 NOTE — Progress Notes (Signed)
Unable to do urine gc/chlamydia today - not enough urine for all tests ordered.

## 2016-02-20 NOTE — Progress Notes (Signed)
  Subjective:transfer from [facility] Kelly Gordon is a [id number] [redacted]w[redacted]d being seen today for her first obstetrical visit.  Her obstetrical history is significant for h/o PTD and CS for herpes outbreak. Patient does intend to breast feed. Pregnancy history fully reviewed.  Patient reports no complaints.  Vitals:   02/20/16 1256  BP: 102/62  Pulse: 72  Weight: 142 lb (64.4 kg)    HISTORY: OB History  Gravida Para Term Preterm AB Living  5 2 1 1 2 2   SAB TAB Ectopic Multiple Live Births  2       1    # Outcome Date GA Lbr Len/2nd Weight Sex Delivery Anes PTL Lv  5 Current           4 Preterm 04/18/11 [redacted]w[redacted]d  4 lb 9.1 oz (2.073 kg) M CS-LTranv Spinal  LIV  3 SAB           2 SAB           1 Term              Past Medical History:  Diagnosis Date  . No pertinent past medical history    Past Surgical History:  Procedure Laterality Date  . CESAREAN SECTION    . NO PAST SURGERIES     History reviewed. No pertinent family history.   Exam    Uterus:     Pelvic Exam:                                        Neurologic: oriented, normal mood   Extremities: normal strength, tone, and muscle mass   HEENT sclera clear, anicteric   Mouth/Teeth dental hygiene good   Neck supple   Cardiovascular: regular rate and rhythm   Respiratory:  appears well, vitals normal, no respiratory distress, acyanotic, normal RR   Abdomen: soft, non-tender; bowel sounds normal; no masses,  no organomegaly   Urinary:        Assessment:    Pregnancy: [id number] Patient Active Problem List   Diagnosis Date Noted  . Supervision of high risk pregnancy, antepartum 02/20/2016  . History of preterm delivery 02/20/2016  . H/O herpes genitalis 02/20/2016        Plan:     Initial labs drawn. Prenatal vitamins. Problem list reviewed and updated. Genetic Screening discussed First Screen:  .was done and AFP ordered today  Ultrasound discussed; fetal survey: results reviewed.  Follow up in 4  weeks. 50% of 30 min visit spent on counseling and coordination of care.  17 P discussed and she accepts, will initiate asap  HSV suppression 34+ weeks  Gordon,Kelly 02/20/2016

## 2016-02-21 LAB — AFP, QUAD SCREEN
AFP: 73.8 ng/mL
Age Alone: 1:905 {titer}
Curr Gest Age: 19.9 weeks
HCG TOTAL: 20.92 [IU]/mL
INH: 169.4 pg/mL
INTERPRETATION-AFP: NEGATIVE
MOM FOR HCG: 0.88
MoM for AFP: 1.14
MoM for INH: 0.94
Open Spina bifida: NEGATIVE
TRI 18 SCR RISK EST: NEGATIVE
UE3 MOM: 1.5
UE3 VALUE: 2.73 ng/mL

## 2016-02-22 LAB — URINE CULTURE: Organism ID, Bacteria: 10000

## 2016-02-24 ENCOUNTER — Encounter (HOSPITAL_COMMUNITY): Payer: Self-pay

## 2016-02-24 LAB — PAIN MGMT, PROFILE 6 CONF W/O MM, U: PLEASE NOTE: 0

## 2016-02-24 LAB — HEMOGLOBINOPATHY EVALUATION
HEMATOCRIT: 29.3 % — AB (ref 35.0–45.0)
HEMOGLOBIN: 9.5 g/dL — AB (ref 11.7–15.5)
HGB A2 QUANT: 2.3 % (ref 1.8–3.5)
Hgb A: 96.7 % (ref >96.0)
Hgb F Quant: <1 % (ref <2.0)
MCH: 28.2 pg (ref 27.0–33.0)
MCV: 86.9 fL (ref 80.0–100.0)
RBC: 3.37 MIL/uL — AB (ref 3.80–5.10)
RDW: 14.9 % (ref 11.0–15.0)

## 2016-02-25 ENCOUNTER — Ambulatory Visit (HOSPITAL_COMMUNITY)
Admission: RE | Admit: 2016-02-25 | Discharge: 2016-02-25 | Disposition: A | Discharge status: Home / Self Care | Payer: Medicaid Other | Source: Ambulatory Visit | Attending: Nurse Practitioner | Admitting: Nurse Practitioner

## 2016-02-25 ENCOUNTER — Other Ambulatory Visit (HOSPITAL_COMMUNITY): Payer: Self-pay | Admitting: Maternal and Fetal Medicine

## 2016-02-25 ENCOUNTER — Encounter (HOSPITAL_COMMUNITY): Payer: Self-pay

## 2016-02-25 DIAGNOSIS — O09899 Supervision of other high risk pregnancies, unspecified trimester: Secondary | ICD-10-CM

## 2016-02-25 DIAGNOSIS — Z3A20 20 weeks gestation of pregnancy: Secondary | ICD-10-CM

## 2016-02-25 DIAGNOSIS — O09212 Supervision of pregnancy with history of pre-term labor, second trimester: Secondary | ICD-10-CM | POA: Diagnosis present

## 2016-02-25 DIAGNOSIS — O09219 Supervision of pregnancy with history of pre-term labor, unspecified trimester: Secondary | ICD-10-CM

## 2016-02-25 LAB — PAIN MGMT, PROFILE 6 CONF W/O MM, U
6 ACETYLMORPHINE: NEGATIVE ng/mL (ref <10)
AMPHETAMINES: NEGATIVE ng/mL (ref <500)
Alcohol Metabolites: NEGATIVE ng/mL (ref <500)
Barbiturates: NEGATIVE ng/mL (ref <300)
Benzodiazepines: NEGATIVE ng/mL (ref <100)
Cocaine Metabolite: NEGATIVE ng/mL (ref <150)
Creatinine: 138.6 mg/dL (ref >=20.0)
Marijuana Metabolite: NEGATIVE ng/mL (ref <20)
Methadone Metabolite: NEGATIVE ng/mL (ref <100)
OXIDANT: NEGATIVE ug/mL (ref <200)
Opiates: NEGATIVE ng/mL (ref <100)
Oxycodone: NEGATIVE ng/mL (ref <100)
PLEASE NOTE: 0
Phencyclidine: NEGATIVE ng/mL (ref <25)
pH: 8.09 (ref 4.5–9.0)

## 2016-02-27 ENCOUNTER — Ambulatory Visit: Payer: Medicaid Other

## 2016-03-05 ENCOUNTER — Ambulatory Visit: Payer: Medicaid Other

## 2016-03-10 ENCOUNTER — Encounter (HOSPITAL_COMMUNITY): Payer: Self-pay

## 2016-03-10 ENCOUNTER — Ambulatory Visit (HOSPITAL_COMMUNITY)
Admission: RE | Admit: 2016-03-10 | Discharge: 2016-03-10 | Disposition: A | Discharge status: Home / Self Care | Payer: Medicaid Other | Source: Ambulatory Visit | Attending: Nurse Practitioner | Admitting: Nurse Practitioner

## 2016-03-10 VITALS — BP 96/56 | HR 80 | Wt 146.0 lb

## 2016-03-10 DIAGNOSIS — O09212 Supervision of pregnancy with history of pre-term labor, second trimester: Secondary | ICD-10-CM | POA: Insufficient documentation

## 2016-03-10 DIAGNOSIS — O34219 Maternal care for unspecified type scar from previous cesarean delivery: Secondary | ICD-10-CM | POA: Diagnosis not present

## 2016-03-10 DIAGNOSIS — O99332 Smoking (tobacco) complicating pregnancy, second trimester: Secondary | ICD-10-CM | POA: Insufficient documentation

## 2016-03-10 DIAGNOSIS — Z3A22 22 weeks gestation of pregnancy: Secondary | ICD-10-CM | POA: Diagnosis not present

## 2016-03-10 DIAGNOSIS — O09219 Supervision of pregnancy with history of pre-term labor, unspecified trimester: Secondary | ICD-10-CM

## 2016-03-10 DIAGNOSIS — Z8751 Personal history of pre-term labor: Secondary | ICD-10-CM

## 2016-03-10 DIAGNOSIS — O09899 Supervision of other high risk pregnancies, unspecified trimester: Secondary | ICD-10-CM

## 2016-03-18 ENCOUNTER — Ambulatory Visit (INDEPENDENT_AMBULATORY_CARE_PROVIDER_SITE_OTHER): Payer: Medicaid Other | Admitting: Obstetrics and Gynecology

## 2016-03-18 ENCOUNTER — Other Ambulatory Visit (HOSPITAL_COMMUNITY)
Admission: RE | Admit: 2016-03-18 | Discharge: 2016-03-18 | Disposition: A | Discharge status: Home / Self Care | Payer: Medicaid Other | Source: Ambulatory Visit | Attending: Obstetrics and Gynecology | Admitting: Obstetrics and Gynecology

## 2016-03-18 VITALS — BP 103/61 | HR 91 | Wt 145.5 lb

## 2016-03-18 DIAGNOSIS — O98312 Other infections with a predominantly sexual mode of transmission complicating pregnancy, second trimester: Secondary | ICD-10-CM

## 2016-03-18 DIAGNOSIS — O0992 Supervision of high risk pregnancy, unspecified, second trimester: Secondary | ICD-10-CM

## 2016-03-18 DIAGNOSIS — Z113 Encounter for screening for infections with a predominantly sexual mode of transmission: Secondary | ICD-10-CM

## 2016-03-18 DIAGNOSIS — O09212 Supervision of pregnancy with history of pre-term labor, second trimester: Secondary | ICD-10-CM | POA: Diagnosis not present

## 2016-03-18 DIAGNOSIS — Z8751 Personal history of pre-term labor: Secondary | ICD-10-CM

## 2016-03-18 DIAGNOSIS — Z8619 Personal history of other infectious and parasitic diseases: Secondary | ICD-10-CM

## 2016-03-18 DIAGNOSIS — A6009 Herpesviral infection of other urogenital tract: Secondary | ICD-10-CM

## 2016-03-18 LAB — POCT URINALYSIS DIP (DEVICE)
Bilirubin Urine: NEGATIVE
Glucose, UA: NEGATIVE mg/dL
HGB URINE DIPSTICK: NEGATIVE
Ketones, ur: NEGATIVE mg/dL
Leukocytes, UA: NEGATIVE
Nitrite: NEGATIVE
PROTEIN: 30 mg/dL — AB
Specific Gravity, Urine: >=1.03 (ref 1.005–1.030)
UROBILINOGEN UA: 1 mg/dL (ref 0.0–1.0)
pH: 5.5 (ref 5.0–8.0)

## 2016-03-18 MED ORDER — HYDROXYPROGESTERONE CAPROATE 250 MG/ML IM OIL
250.0000 mg | TOPICAL_OIL | INTRAMUSCULAR | Status: AC
  Administered 2016-03-18 – 2016-06-15 (×6): 250 mg via INTRAMUSCULAR

## 2016-03-18 NOTE — Progress Notes (Signed)
C/o braxton hicks contractions occasionally.

## 2016-03-18 NOTE — Patient Instructions (Signed)

## 2016-03-18 NOTE — Progress Notes (Signed)
Subjective:  Kelly Gordon is a 25 y.o. [id number] at 10631w5d being seen today for ongoing prenatal care.  She is currently monitored for the following issues for this high-risk pregnancy and has Supervision of high risk pregnancy, antepartum; History of preterm delivery; and H/O herpes genitalis on her problem list.  Patient reports occasional contractions.  Contractions: Irregular. Vag. Bleeding: None.  Movement: Present. Denies leaking of fluid.   The following portions of the patient's history were reviewed and updated as appropriate: allergies, current medications, past family history, past medical history, past social history, past surgical history and problem list. Problem list updated.  Objective:   Vitals:   03/18/16 1330  BP: 103/61  Pulse: 91  Weight: 145 lb 8 oz (66 kg)    Fetal Status: Fetal Heart Rate (bpm): 160   Movement: Present     General:  Alert, oriented and cooperative. Patient is in no acute distress.  Skin: Skin is warm and dry. No rash noted.   Cardiovascular: Normal heart rate noted  Respiratory: Normal respiratory effort, no problems with respiration noted  Abdomen: Soft, gravid, appropriate for gestational age. Pain/Pressure: Present     Pelvic:  Cervical exam deferred        Extremities: Normal range of motion.  Edema: None  Mental Status: Normal mood and affect. Normal behavior. Normal judgment and thought content.   Urinalysis:      Assessment and Plan:  Pregnancy: [id number] at 1431w5d  1. Supervision of high risk pregnancy, antepartum, second trimester  - GC/Chlamydia probe amp ([place]  2. History of preterm delivery Start 17 OHP qweekly, first dose today - GC[facility]lam[facility]  3. H/O herpes genitalis Will start Suppression at 34-36 weeks  Preterm labor symptoms and general obstetric precautions including but not limited to vaginal bleeding, contractions, leaking of fluid and fetal movement were reviewed  in detail with the patient. Please refer to After Visit Summary for other counseling recommendations.  Return in about 4 weeks (around 04/15/2016) for OB visit.   Hermina StaggersMichael L Ples Trudel, MD

## 2016-03-18 NOTE — Addendum Note (Signed)
Addended by: Gerome ApleyZEYFANG, LINDA L on: 03/18/2016 02:14 PM   Modules accepted: Orders

## 2016-03-19 LAB — GC/CHLAMYDIA PROBE AMP (~~LOC~~) NOT AT ARMC
Chlamydia: NEGATIVE
Neisseria Gonorrhea: NEGATIVE

## 2016-03-25 ENCOUNTER — Ambulatory Visit (HOSPITAL_COMMUNITY)
Admission: RE | Admit: 2016-03-25 | Discharge: 2016-03-25 | Disposition: A | Discharge status: Home / Self Care | Payer: Medicaid Other | Source: Ambulatory Visit | Attending: Nurse Practitioner | Admitting: Nurse Practitioner

## 2016-03-25 ENCOUNTER — Encounter (HOSPITAL_COMMUNITY): Payer: Self-pay

## 2016-03-25 ENCOUNTER — Ambulatory Visit (INDEPENDENT_AMBULATORY_CARE_PROVIDER_SITE_OTHER): Payer: Medicaid Other | Admitting: *Deleted

## 2016-03-25 VITALS — BP 104/48 | HR 62

## 2016-03-25 DIAGNOSIS — O99332 Smoking (tobacco) complicating pregnancy, second trimester: Secondary | ICD-10-CM | POA: Insufficient documentation

## 2016-03-25 DIAGNOSIS — Z3A24 24 weeks gestation of pregnancy: Secondary | ICD-10-CM | POA: Diagnosis not present

## 2016-03-25 DIAGNOSIS — O09212 Supervision of pregnancy with history of pre-term labor, second trimester: Secondary | ICD-10-CM | POA: Insufficient documentation

## 2016-03-25 DIAGNOSIS — O34212 Maternal care for vertical scar from previous cesarean delivery: Secondary | ICD-10-CM | POA: Diagnosis not present

## 2016-03-25 DIAGNOSIS — O0992 Supervision of high risk pregnancy, unspecified, second trimester: Secondary | ICD-10-CM

## 2016-03-25 DIAGNOSIS — Z8751 Personal history of pre-term labor: Secondary | ICD-10-CM

## 2016-04-01 ENCOUNTER — Ambulatory Visit (INDEPENDENT_AMBULATORY_CARE_PROVIDER_SITE_OTHER): Payer: Medicaid Other

## 2016-04-01 VITALS — BP 104/58 | HR 68

## 2016-04-01 DIAGNOSIS — O09212 Supervision of pregnancy with history of pre-term labor, second trimester: Secondary | ICD-10-CM | POA: Diagnosis not present

## 2016-04-01 DIAGNOSIS — Z8751 Personal history of pre-term labor: Secondary | ICD-10-CM

## 2016-04-01 NOTE — Progress Notes (Signed)
Patient presented to office today for her 17-P. Patient tolerated injection well and will follow up next wed 04/08/2016 for her next one.

## 2016-04-08 ENCOUNTER — Ambulatory Visit (INDEPENDENT_AMBULATORY_CARE_PROVIDER_SITE_OTHER): Payer: Medicaid Other

## 2016-04-08 VITALS — BP 116/72 | HR 80

## 2016-04-08 DIAGNOSIS — Z8751 Personal history of pre-term labor: Secondary | ICD-10-CM

## 2016-04-08 DIAGNOSIS — O09212 Supervision of pregnancy with history of pre-term labor, second trimester: Secondary | ICD-10-CM

## 2016-04-08 NOTE — Progress Notes (Signed)
Patient presented to office today for her 17-p injection given in LG area. Patient tolerated well and will follow up next week for her next injection.

## 2016-04-15 ENCOUNTER — Ambulatory Visit (INDEPENDENT_AMBULATORY_CARE_PROVIDER_SITE_OTHER): Payer: Medicaid Other | Admitting: Family

## 2016-04-15 ENCOUNTER — Encounter: Payer: Self-pay | Admitting: Family

## 2016-04-15 VITALS — BP 105/56 | HR 74 | Wt 146.8 lb

## 2016-04-15 DIAGNOSIS — O34219 Maternal care for unspecified type scar from previous cesarean delivery: Secondary | ICD-10-CM

## 2016-04-15 DIAGNOSIS — O09212 Supervision of pregnancy with history of pre-term labor, second trimester: Secondary | ICD-10-CM

## 2016-04-15 DIAGNOSIS — Z8751 Personal history of pre-term labor: Secondary | ICD-10-CM

## 2016-04-15 DIAGNOSIS — Z23 Encounter for immunization: Secondary | ICD-10-CM | POA: Diagnosis not present

## 2016-04-15 DIAGNOSIS — O0992 Supervision of high risk pregnancy, unspecified, second trimester: Secondary | ICD-10-CM

## 2016-04-15 LAB — CBC
HEMATOCRIT: 29.1 % — AB (ref 35.0–45.0)
HEMOGLOBIN: 9.7 g/dL — AB (ref 11.7–15.5)
MCH: 28.4 pg (ref 27.0–33.0)
MCHC: 33.3 g/dL (ref 32.0–36.0)
MCV: 85.1 fL (ref 80.0–100.0)
MPV: 8.9 fL (ref 7.5–12.5)
Platelets: 185 10*3/uL (ref 140–400)
RBC: 3.42 MIL/uL — AB (ref 3.80–5.10)
RDW: 13.7 % (ref 11.0–15.0)
WBC: 8.1 10*3/uL (ref 3.8–10.8)

## 2016-04-15 MED ORDER — TETANUS-DIPHTH-ACELL PERTUSSIS 5-2.5-18.5 LF-MCG/0.5 IM SUSP
0.5000 mL | Freq: Once | INTRAMUSCULAR | Status: AC
  Administered 2016-04-15: 0.5 mL via INTRAMUSCULAR

## 2016-04-15 NOTE — Progress Notes (Signed)
Kelly Gordon declines flu shot.

## 2016-04-15 NOTE — Progress Notes (Signed)
Reviewed weight gain chart with patient and suggested she eat a little more broken into 3 small meals and 3 snacks.

## 2016-04-15 NOTE — Progress Notes (Signed)
   PRENATAL VISIT NOTE  Subjective:  Kelly Gordon is a 25 y.o. [id number] at 2233w5d being seen today for ongoing prenatal care.  She is currently monitored for the following issues for this high-risk pregnancy and has Supervision of high risk pregnancy, antepartum; History of preterm delivery; H/O herpes genitalis; and History of cesarean delivery, antepartum on her problem list.  Patient reports no complaints.  Contractions: Irregular. Vag. Bleeding: None.  Movement: Present. Denies leaking of fluid.   The following portions of the patient's history were reviewed and updated as appropriate: allergies, current medications, past family history, past medical history, past social history, past surgical history and problem list. Problem list updated.  Objective:   Vitals:   04/15/16 1032  BP: (!) 105/56  Pulse: 74  Weight: 146 lb 12.8 oz (66.6 kg)    Fetal Status: Fetal Heart Rate (bpm): 140 Fundal Height: 28 cm Movement: Present     General:  Alert, oriented and cooperative. Patient is in no acute distress.  Skin: Skin is warm and dry. No rash noted.   Cardiovascular: Normal heart rate noted  Respiratory: Normal respiratory effort, no problems with respiration noted  Abdomen: Soft, gravid, appropriate for gestational age. Pain/Pressure: Present     Pelvic:  Cervical exam deferred        Extremities: Normal range of motion.  Edema: None  Mental Status: Normal mood and affect. Normal behavior. Normal judgment and thought content.   Urinalysis:      Assessment and Plan:  Pregnancy: [id number] at 4833w5d  1. History of preterm delivery - 17-p today  2. Supervision of high risk pregnancy, antepartum, second trimester - Glucose Tolerance, 1 HR (50g) - CBC - RPR - Tdap (BOOSTRIX) injection 0.5 mL; Inject 0.5 mLs into the muscle once. - HIV antibody - Hepatitis B surface antigen  3. History of cesarean delivery, antepartum - Given consent to review  Preterm labor symptoms and general  obstetric precautions including but not limited to vaginal bleeding, contractions, leaking of fluid and fetal movement were reviewed in detail with the patient. Please refer to After Visit Summary for other counseling recommendations.  Return in about 2 weeks (around 04/29/2016) for Needs weekly 17p appts. , 17p weekly.  Eino FarberWalidah Kennith GainN Karim, CNM

## 2016-04-16 LAB — GLUCOSE TOLERANCE, 1 HOUR (50G) W/O FASTING: Glucose, 1 Hr, gestational: 125 mg/dL (ref <140)

## 2016-04-16 LAB — HIV ANTIBODY (ROUTINE TESTING W REFLEX): HIV 1&2 Ab, 4th Generation: NONREACTIVE

## 2016-04-16 LAB — HEPATITIS B SURFACE ANTIGEN: Hepatitis B Surface Ag: NEGATIVE

## 2016-04-17 LAB — RPR

## 2016-04-24 ENCOUNTER — Ambulatory Visit: Payer: Medicaid Other

## 2016-05-01 ENCOUNTER — Ambulatory Visit: Payer: Medicaid Other

## 2016-05-08 ENCOUNTER — Ambulatory Visit (INDEPENDENT_AMBULATORY_CARE_PROVIDER_SITE_OTHER): Payer: Medicaid Other | Admitting: Clinical

## 2016-05-08 ENCOUNTER — Ambulatory Visit (INDEPENDENT_AMBULATORY_CARE_PROVIDER_SITE_OTHER): Payer: Medicaid Other | Admitting: Family Medicine

## 2016-05-08 VITALS — BP 98/59 | HR 66 | Wt 146.4 lb

## 2016-05-08 DIAGNOSIS — O0992 Supervision of high risk pregnancy, unspecified, second trimester: Secondary | ICD-10-CM

## 2016-05-08 DIAGNOSIS — O09212 Supervision of pregnancy with history of pre-term labor, second trimester: Secondary | ICD-10-CM

## 2016-05-08 DIAGNOSIS — F4323 Adjustment disorder with mixed anxiety and depressed mood: Secondary | ICD-10-CM

## 2016-05-08 DIAGNOSIS — Z8751 Personal history of pre-term labor: Secondary | ICD-10-CM

## 2016-05-08 LAB — POCT URINALYSIS DIP (DEVICE)
Bilirubin Urine: NEGATIVE
GLUCOSE, UA: NEGATIVE mg/dL
Hgb urine dipstick: NEGATIVE
Ketones, ur: NEGATIVE mg/dL
LEUKOCYTES UA: NEGATIVE
NITRITE: NEGATIVE
PROTEIN: NEGATIVE mg/dL
SPECIFIC GRAVITY, URINE: 1.015 (ref 1.005–1.030)
UROBILINOGEN UA: 1 mg/dL (ref 0.0–1.0)
pH: 8.5 — ABNORMAL HIGH (ref 5.0–8.0)

## 2016-05-08 NOTE — BH Specialist Note (Signed)
Session Start time: 9:50   End Time: 10:10 Total Time:  20 minutes Type of Service: Behavioral Health - Individual/Family Interpreter: No.   Interpreter Name & Language: n/a # [facility] Visits July 2017-June 2018: 1st   SUBJECTIVE: Kelly BeachShante M Gordon is a 25 y.o. female  Pt. was referred by Candelaria CelesteJacob Stinson, DO for:  anxiety and depression. Pt. reports the following symptoms/concerns: Pt states that her primary symptoms are being anxious and irritable, but that she feels she is managing well without any additional strategies.  Duration of problem: Increase current pregnancy Severity: moderate Previous treatment: none   OBJECTIVE: Mood: Appropriate & Affect: Appropriate Risk of harm to self or others: no known risk of harm to self and others Assessments administered: PHQ9: 13/ GAD7: 13  LIFE CONTEXT:  Family & Social: Lives with children, 5 and 7 School/ Work: Works at [employer] [place] [employer] Self-Care: no concerns with self-care Life changes: Current pregnancy   GOALS ADDRESSED:  -Alleviate symptoms of anxiety and depression  INTERVENTIONS: Strength-based   ASSESSMENT:  Pt currently experiencing Adjustment disorder with mixed anxious and depressed mood.  Pt may benefit from psychoeducation and brief therapeutic interventions regarding coping with symptoms of anxiety and depression.Marland Kitchen.      PLAN: 1. F/U with behavioral health clinician: as needed 2. Behavioral Health meds: none 3. Behavioral recommendations:  -Consider reading educational material regarding coping with symptoms of anxiety and depression -Consider calming apps for additional self-care  4. Referral: Brief Counseling/Psychotherapy and Psychoeducation   Woc-Behavioral Health Clinician  Behavioral Health Clinician  Marlon PelWarmhandoff:   Warm Hand Off Completed.        Depression screen [facility]: Lambert Rhonda WHQ 2/9 05/08/2016 04/15/2016 02/20/2016  Decreased Interest 2 0 0  Down, Depressed, Hopeless 2 0 0  PHQ - 2 Score 4 0 0   Altered sleeping 2 2 1   Tired, decreased energy 2 3 0  Change in appetite 2 2 0  Feeling bad or failure about yourself  2 0 0  Trouble concentrating 1 0 0  Moving slowly or fidgety/restless 0 0 0  Suicidal thoughts 0 0 0  PHQ-9 Score 13 7 1    GAD 7 : Generalized Anxiety Score 05/08/2016 04/15/2016 02/20/2016  Nervous, Anxious, on Edge 3 2 0  Control/stop worrying 2 2 0  Worry too much - different things 2 2 0  Trouble relaxing 1 0 0  Restless 0 0 1  Easily annoyed or irritable 3 2 1   Afraid - awful might happen 2 0 0  Total GAD 7 Score 13 8 2

## 2016-05-08 NOTE — Progress Notes (Signed)
   PRENATAL VISIT NOTE  Subjective:  Kelly Gordon is a 25 y.o. [id number] at 2380w0d being seen today for ongoing prenatal care.  She is currently monitored for the following issues for this high-risk pregnancy and has Supervision of high risk pregnancy, antepartum; History of preterm delivery; H/O herpes genitalis; and History of cesarean delivery, antepartum on her problem list.  Patient reports no complaints.  Contractions: Not present. Vag. Bleeding: None.  Movement: Present. Denies leaking of fluid.   The following portions of the patient's history were reviewed and updated as appropriate: allergies, current medications, past family history, past medical history, past social history, past surgical history and problem list. Problem list updated.  Objective:   Vitals:   05/08/16 0843  BP: (!) 98/59  Pulse: 66  Weight: 146 lb 6.4 oz (66.4 kg)    Fetal Status: Fetal Heart Rate (bpm): 138 Fundal Height: 32 cm Movement: Present     General:  Alert, oriented and cooperative. Patient is in no acute distress.  Skin: Skin is warm and dry. No rash noted.   Cardiovascular: Normal heart rate noted  Respiratory: Normal respiratory effort, no problems with respiration noted  Abdomen: Soft, gravid, appropriate for gestational age. Pain/Pressure: Present     Pelvic:  Cervical exam deferred        Extremities: Normal range of motion.  Edema: Trace  Mental Status: Normal mood and affect. Normal behavior. Normal judgment and thought content.   Assessment and Plan:  Pregnancy: [id number] at 6080w0d  1. Supervision of high risk pregnancy, antepartum, second trimester FHT and FH normal. Recommended ensure or boost to help with weight gain as pt has decreased appetite.  2. History of preterm delivery Continue 17-P  Preterm labor symptoms and general obstetric precautions including but not limited to vaginal bleeding, contractions, leaking of fluid and fetal movement were reviewed in detail with the  patient. Please refer to After Visit Summary for other counseling recommendations.  Return in about 2 weeks (around 05/22/2016), or OB f/u.  Levie HeritageJacob J Taishaun Levels, DO

## 2016-05-08 NOTE — Progress Notes (Signed)
17-p injection given Needs to see Asher MuirJamie

## 2016-05-15 ENCOUNTER — Ambulatory Visit (INDEPENDENT_AMBULATORY_CARE_PROVIDER_SITE_OTHER): Payer: Medicaid Other | Admitting: *Deleted

## 2016-05-15 DIAGNOSIS — O09213 Supervision of pregnancy with history of pre-term labor, third trimester: Secondary | ICD-10-CM | POA: Diagnosis not present

## 2016-05-15 DIAGNOSIS — Z8751 Personal history of pre-term labor: Secondary | ICD-10-CM

## 2016-05-22 ENCOUNTER — Ambulatory Visit: Payer: Self-pay

## 2016-05-25 ENCOUNTER — Encounter: Payer: Self-pay | Admitting: Family Medicine

## 2016-05-27 ENCOUNTER — Other Ambulatory Visit: Payer: Self-pay | Admitting: Student

## 2016-05-27 ENCOUNTER — Ambulatory Visit (INDEPENDENT_AMBULATORY_CARE_PROVIDER_SITE_OTHER): Payer: Medicaid Other | Admitting: Family

## 2016-05-27 VITALS — BP 104/68 | HR 77 | Wt 148.3 lb

## 2016-05-27 DIAGNOSIS — O09213 Supervision of pregnancy with history of pre-term labor, third trimester: Secondary | ICD-10-CM

## 2016-05-27 DIAGNOSIS — Z8751 Personal history of pre-term labor: Secondary | ICD-10-CM

## 2016-05-27 DIAGNOSIS — O34219 Maternal care for unspecified type scar from previous cesarean delivery: Secondary | ICD-10-CM

## 2016-05-27 DIAGNOSIS — O0990 Supervision of high risk pregnancy, unspecified, unspecified trimester: Secondary | ICD-10-CM

## 2016-05-27 DIAGNOSIS — Z8619 Personal history of other infectious and parasitic diseases: Secondary | ICD-10-CM

## 2016-05-27 MED ORDER — VALACYCLOVIR HCL 500 MG PO TABS: 500.0000 mg | ORAL_TABLET | Freq: Two times a day (BID) | ORAL | Status: DC

## 2016-05-27 MED ORDER — VALACYCLOVIR HCL 500 MG PO TABS: 500.0000 mg | ORAL_TABLET | Freq: Two times a day (BID) | ORAL | 0 refills | Status: DC

## 2016-05-27 NOTE — Addendum Note (Signed)
Addended by: Cheree DittoGRAHAM, Egan Berkheimer A on: 05/27/2016 09:10 AM   Modules accepted: Orders

## 2016-05-27 NOTE — Progress Notes (Signed)
   PRENATAL VISIT NOTE  Subjective:  Kelly Gordon is a 25 y.o. [id number] at 2253w5d being seen today for ongoing prenatal care.  She is currently monitored for the following issues for this high-risk pregnancy and has Supervision of high risk pregnancy, antepartum; History of preterm delivery; H/O herpes genitalis; and History of cesarean delivery, antepartum on her problem list.  Patient reports no complaints.  Contractions: Not present.  .  Movement: Present. Denies leaking of fluid.   The following portions of the patient's history were reviewed and updated as appropriate: allergies, current medications, past family history, past medical history, past social history, past surgical history and problem list. Problem list updated.  Objective:   Vitals:   05/27/16 0821  BP: 104/68  Pulse: 77  Weight: 148 lb 4.8 oz (67.3 kg)    Fetal Status: Fetal Heart Rate (bpm): 133   Movement: Present     General:  Alert, oriented and cooperative. Patient is in no acute distress.  Skin: Skin is warm and dry. No rash noted.   Cardiovascular: Normal heart rate noted  Respiratory: Normal respiratory effort, no problems with respiration noted  Abdomen: Soft, gravid, appropriate for gestational age. Pain/Pressure: Present     Pelvic:  Cervical exam deferred        Extremities: Normal range of motion.  Edema: Trace  Mental Status: Normal mood and affect. Normal behavior. Normal judgment and thought content.   Assessment and Plan:  Pregnancy: [id number] at 5353w5d  1. Supervision of high risk pregnancy, antepartum Patient doing well; plans BTL and papers signed today.   2. H/O herpes genitalis Begin valtrex 500 mg BID; rx sent to pharmacy on file.   3. History of preterm delivery Makena today; continue to receive injections  4. History of cesarean delivery, antepartum Patient to schedule her c/section today for 39 weeks.   Preterm labor symptoms and general obstetric precautions including but not  limited to vaginal bleeding, contractions, leaking of fluid and fetal movement were reviewed in detail with the patient. Please refer to After Visit Summary for other counseling recommendations.  No Follow-up on file.   Marylene LandKathryn Lorraine Kooistra, CNM

## 2016-05-27 NOTE — Patient Instructions (Signed)
Preterm Labor Information Preterm labor is when labor starts at less than 37 weeks of pregnancy. The normal length of a pregnancy is 39 to 41 weeks. CAUSES Often, there is no identifiable underlying cause as to why a woman goes into preterm labor. One of the most common known causes of preterm labor is infection. Infections of the uterus, cervix, vagina, amniotic sac, bladder, kidney, or even the lungs (pneumonia) can cause labor to start. Other suspected causes of preterm labor include:   Urogenital infections, such as yeast infections and bacterial vaginosis.   Uterine abnormalities (uterine shape, uterine septum, fibroids, or bleeding from the placenta).   A cervix that has been operated on (it may fail to stay closed).   Malformations in the fetus.   Multiple gestations (twins, triplets, and so on).   Breakage of the amniotic sac.  RISK FACTORS  Having a previous history of preterm labor.   Having premature rupture of membranes (PROM).   Having a placenta that covers the opening of the cervix (placenta previa).   Having a placenta that separates from the uterus (placental abruption).   Having a cervix that is too weak to hold the fetus in the uterus (incompetent cervix).   Having too much fluid in the amniotic sac (polyhydramnios).   Taking illegal drugs or smoking while pregnant.   Not gaining enough weight while pregnant.   Being younger than 818 and older than 27 years old.   Having a low socioeconomic status.   Being African American. SYMPTOMS Signs and symptoms of preterm labor include:   Menstrual-like cramps, abdominal pain, or back pain.  Uterine contractions that are regular, as frequent as six in an hour, regardless of their intensity (may be mild or painful).  Contractions that start on the top of the uterus and spread down to the lower abdomen and back.   A sense of increased pelvic pressure.   A watery or bloody mucus discharge that  comes from the vagina.  TREATMENT Depending on the length of the pregnancy and other circumstances, your health care provider may suggest bed rest. If necessary, there are medicines that can be given to stop contractions and to mature the fetal lungs. If labor happens before 34 weeks of pregnancy, a prolonged hospital stay may be recommended. Treatment depends on the condition of both you and the fetus.  WHAT SHOULD YOU DO IF YOU THINK YOU ARE IN PRETERM LABOR? Call your health care provider right away. You will need to go to the hospital to get checked immediately. HOW CAN YOU PREVENT PRETERM LABOR IN FUTURE PREGNANCIES? You should:   Stop smoking if you smoke.  Maintain healthy weight gain and avoid chemicals and drugs that are not necessary.  Be watchful for any type of infection.  Inform your health care provider if you have a known history of preterm labor.   This information is not intended to replace advice given to you by your health care provider. Make sure you discuss any questions you have with your health care provider.   Document Released: 09/26/2003 Document Revised: 03/08/2013 Document Reviewed: 08/08/2012 Elsevier Interactive Patient Education 2016 Elsevier Inc. Herpes During and After Pregnancy Genital herpes is a sexually transmitted infection (STI). It is caused by a virus and can be very serious during pregnancy. The greatest concern is passing the virus to the fetus and newborn. The virus is more likely to be passed to the newborn during delivery if the mother becomes infected for the first time  late in her pregnancy (primary infection). It is less common for the virus to pass to the newborn if the mother had herpes before becoming pregnant. This is because antibodies against the virus develop over a period of time. These antibodies help protect the baby. Lastly, the infection can pass to the fetus through the placenta. This can happen if the mother gets herpes for the  first time in the first 3 months of her pregnancy (first trimester). This can possibly cause a miscarriage or birth defects in the baby. TREATMENT DURING PREGNANCY Medicines may be prescribed that are safe for the mother and the fetus. The medicine can lessen symptoms or prevent a recurrence of the infection. If the infection happened before becoming pregnant, medicine may be prescribed in the last 4 weeks of the pregnancy. This can prevent a recurrent infection at the time of delivery.  RECOMMENDED DELIVERY METHOD If an active, recurrent infection is present at the time delivery, the baby should be delivered by cesarean delivery. This is because the virus can pass to the baby through an infected birth canal. This can cause severe problems for the baby. If the infection happens for the first time late in the pregnancy, the caregiver may also recommend a cesarean delivery. With a new infection, the body has not had the time to build up enough antibodies against the virus to protect the baby from getting the infection. Even if the birth canal does not have visible sores (lesions) from the herpes virus, there is still that chance that the virus can spread to the baby. A cesarean delivery should be done if there is any signs or feeling of an infection being present in the genital area. Cesarean delivery is not recommended for women with a history of herpes infection but no evidence of active genital lesions at the time of delivery. Lesions that have crusted fully are considered healed and not active. BREASTFEEDING  Women infected with genital herpes can breastfeed their baby. The virus will not be present in the breast milk. If lesions are present on the breast, the baby should not breastfeed from the affected breast(s). HOW TO PREVENT PASSING HERPES TO YOUR BABY AFTER DELIVERY  Wash your hands with soap and water often and before touching your baby.  If you have an outbreak, keep the area clean and  covered.  Try to avoid physical and stressful situations that may bring on an outbreak. SEEK IMMEDIATE MEDICAL CARE IF:   You have an outbreak during pregnancy and cannot urinate.  You have an outbreak anytime during your pregnancy and especially in the last 3 months of the pregnancy.  You think you are having an allergic reaction or side effects from the medicine you are taking.   This information is not intended to replace advice given to you by your health care provider. Make sure you discuss any questions you have with your health care provider.   Document Released: 10/12/2000 Document Revised: 07/27/2014 Document Reviewed: 06/16/2011 Elsevier Interactive Patient Education [employer]2016 Elsevier Inc.

## 2016-05-28 ENCOUNTER — Telehealth: Payer: Self-pay

## 2016-05-28 NOTE — Telephone Encounter (Signed)
Error

## 2016-05-29 ENCOUNTER — Encounter (HOSPITAL_COMMUNITY): Payer: Self-pay | Admitting: *Deleted

## 2016-06-01 ENCOUNTER — Telehealth: Payer: Self-pay | Admitting: *Deleted

## 2016-06-01 NOTE — Telephone Encounter (Signed)
Received makena for PepsiCoShante today- do not see any appointments sheduled for injections- called and left a message for her to call to make appt for injection.

## 2016-06-03 ENCOUNTER — Encounter: Payer: Self-pay | Admitting: *Deleted

## 2016-06-03 NOTE — Telephone Encounter (Signed)
Called Tere again to schedule injection appointment. Left message we are calling her to schedule appt- please call office. Will also send letter.

## 2016-06-15 ENCOUNTER — Encounter: Payer: Self-pay | Admitting: Obstetrics & Gynecology

## 2016-06-15 ENCOUNTER — Other Ambulatory Visit (HOSPITAL_COMMUNITY)
Admission: RE | Admit: 2016-06-15 | Discharge: 2016-06-15 | Disposition: A | Discharge status: Home / Self Care | Payer: Medicaid Other | Source: Ambulatory Visit | Attending: Family Medicine | Admitting: Family Medicine

## 2016-06-15 ENCOUNTER — Ambulatory Visit (INDEPENDENT_AMBULATORY_CARE_PROVIDER_SITE_OTHER): Payer: Medicaid Other | Admitting: General Practice

## 2016-06-15 VITALS — BP 97/62 | HR 99 | Ht 61.0 in | Wt 149.0 lb

## 2016-06-15 DIAGNOSIS — O09212 Supervision of pregnancy with history of pre-term labor, second trimester: Secondary | ICD-10-CM

## 2016-06-15 DIAGNOSIS — Z113 Encounter for screening for infections with a predominantly sexual mode of transmission: Secondary | ICD-10-CM | POA: Insufficient documentation

## 2016-06-15 DIAGNOSIS — O0990 Supervision of high risk pregnancy, unspecified, unspecified trimester: Secondary | ICD-10-CM

## 2016-06-15 DIAGNOSIS — Z8751 Personal history of pre-term labor: Secondary | ICD-10-CM

## 2016-06-15 MED ORDER — VALACYCLOVIR HCL 500 MG PO TABS: 500.0000 mg | ORAL_TABLET | Freq: Two times a day (BID) | ORAL | 0 refills | Status: DC

## 2016-06-15 NOTE — Progress Notes (Addendum)
Patient arrived late to appt and was not seen by provider. Discussed patient with Dr Penne LashLeggett who advised 36 week cultures, 17p & blood pressure. Auscultated FHR 131. Will instruct patient in self swab of GBS, GC/ch. Patient will see provider next week for regular OB visit & cervical check. Patient endorses good fetal movement and no contractions. Patient states she lost her valtrex and needs new Rx. Will refill. Patient had no questions

## 2016-06-16 LAB — CULTURE, BETA STREP (GROUP B ONLY)

## 2016-06-17 LAB — GC/CHLAMYDIA PROBE AMP (~~LOC~~) NOT AT ARMC
CHLAMYDIA, DNA PROBE: NEGATIVE
Neisseria Gonorrhea: NEGATIVE

## 2016-06-22 ENCOUNTER — Ambulatory Visit: Payer: Medicaid Other | Admitting: Obstetrics and Gynecology

## 2016-06-22 ENCOUNTER — Telehealth: Payer: Self-pay | Admitting: Obstetrics and Gynecology

## 2016-06-22 DIAGNOSIS — O34219 Maternal care for unspecified type scar from previous cesarean delivery: Secondary | ICD-10-CM

## 2016-06-22 DIAGNOSIS — Z8751 Personal history of pre-term labor: Secondary | ICD-10-CM

## 2016-06-22 DIAGNOSIS — O0990 Supervision of high risk pregnancy, unspecified, unspecified trimester: Secondary | ICD-10-CM

## 2016-06-22 NOTE — Progress Notes (Signed)
Patient left before being called back to room.

## 2016-06-23 NOTE — Progress Notes (Signed)
Pt left without be seen

## 2016-06-26 ENCOUNTER — Encounter (HOSPITAL_COMMUNITY): Payer: Self-pay

## 2016-06-29 ENCOUNTER — Ambulatory Visit (INDEPENDENT_AMBULATORY_CARE_PROVIDER_SITE_OTHER): Payer: Medicaid Other | Admitting: Obstetrics & Gynecology

## 2016-06-29 VITALS — BP 103/65 | HR 59 | Wt 153.8 lb

## 2016-06-29 DIAGNOSIS — Z8751 Personal history of pre-term labor: Secondary | ICD-10-CM

## 2016-06-29 DIAGNOSIS — O09213 Supervision of pregnancy with history of pre-term labor, third trimester: Secondary | ICD-10-CM

## 2016-06-29 DIAGNOSIS — O0990 Supervision of high risk pregnancy, unspecified, unspecified trimester: Secondary | ICD-10-CM

## 2016-06-29 NOTE — Progress Notes (Signed)
   PRENATAL VISIT NOTE  Subjective:CS and BTL in 4 days, declines TOLAC  Kelly Gordon is a 25 y.o. [id number] at 3629w3d being seen today for ongoing prenatal care.  She is currently monitored for the following issues for this low-risk pregnancy and has Supervision of high risk pregnancy, antepartum; History of preterm delivery; H/O herpes genitalis; and History of cesarean delivery, antepartum on her problem list.  Patient reports no complaints.  Contractions: Irregular. Vag. Bleeding: None.  Movement: Present. Denies leaking of fluid.   The following portions of the patient's history were reviewed and updated as appropriate: allergies, current medications, past family history, past medical history, past social history, past surgical history and problem list. Problem list updated.  Objective:   Vitals:   06/29/16 1517  BP: 103/65  Pulse: (!) 59  Weight: 153 lb 12.8 oz (69.8 kg)    Fetal Status: Fetal Heart Rate (bpm): 153   Movement: Present     General:  Alert, oriented and cooperative. Patient is in no acute distress.  Skin: Skin is warm and dry. No rash noted.   Cardiovascular: Normal heart rate noted  Respiratory: Normal respiratory effort, no problems with respiration noted  Abdomen: Soft, gravid, appropriate for gestational age. Pain/Pressure: Present     Pelvic:  Cervical exam deferred        Extremities: Normal range of motion.  Edema: Trace  Mental Status: Normal mood and affect. Normal behavior. Normal judgment and thought content.   Assessment and Plan:  Pregnancy: [id number] at 7529w3d  1. Supervision of high risk pregnancy, antepartum RCS 39 weeks and BTL  2. History of preterm delivery 34 weeks  Term labor symptoms and general obstetric precautions including but not limited to vaginal bleeding, contractions, leaking of fluid and fetal movement were reviewed in detail with the patient.  Please refer to After Visit Summary for other counseling recommendations. The risks  of cesarean section discussed with the patient included but were not limited to: bleeding which may require transfusion or reoperation; infection which may require antibiotics; injury to bowel, bladder, ureters or other surrounding organs; injury to the fetus; need for additional procedures including hysterectomy in the event of a life-threatening hemorrhage; placental abnormalities wth subsequent pregnancies, incisional problems, thromboembolic phenomenon and other postoperative/anesthesia complications.  Return in about 6 weeks (around 08/10/2016) for postop CS.   Adam PhenixJames G Lugene Beougher, MD

## 2016-06-29 NOTE — Patient Instructions (Signed)
Cesarean Delivery °Cesarean birth, or cesarean delivery, is the surgical delivery of a baby through an incision in the abdomen and the uterus. This may be referred to as a C-section. This procedure may be scheduled ahead of time, or it may be done in an emergency situation. °Tell a health care provider about: °· Any allergies you have. °· All medicines you are taking, including vitamins, herbs, eye drops, creams, and over-the-counter medicines. °· Any problems you or family members have had with anesthetic medicines. °· Any blood disorders you have. °· Any surgeries you have had. °· Any medical conditions you have. °· Whether you or any members of your family have a history of deep vein thrombosis (DVT) or pulmonary embolism (PE). °What are the risks? °Generally, this is a safe procedure. However, problems may occur, including: °· Infection. °· Bleeding. °· Allergic reactions to medicines. °· Damage to other structures or organs. °· Blood clots. °· Injury to your baby. ° °What happens before the procedure? °· Follow instructions from your health care provider about eating or drinking restrictions. °· Follow instructions from your health care provider about bathing before your procedure to help reduce your risk of infection. °· If you know that you are going to have a cesarean delivery, do not shave your pubic area. Shaving before the procedure may increase your risk of infection. °· Ask your health care provider about: °? Changing or stopping your regular medicines. This is especially important if you are taking diabetes medicines or blood thinners. °? Your pain management plan. This is especially important if you plan to breastfeed your baby. °? How long you will be in the hospital after the procedure. °? Any concerns you may have about receiving blood products if you need them during the procedure. °? Cord blood banking, if you plan to collect your baby’s umbilical cord blood. °· You may also want to ask your  health care provider: °? Whether you will be able to hold or breastfeed your baby while you are still in the operating room. °? Whether your baby can stay with you immediately after the procedure and during your recovery. °? Whether a family member or a person of your choice can go with you into the operating room and stay with you during the procedure, immediately after the procedure, and during your recovery. °· Plan to have someone drive you home when you are discharged from the hospital. °What happens during the procedure? °· Fetal monitors will be placed on your abdomen to monitor your heart rate and your baby's heart rate. °· Depending on the reason for your cesarean delivery, you may have a physical exam or additional testing, such as an ultrasound. °· An IV tube will be inserted into one of your veins. °· You may have your blood or urine tested. °· You will be given antibiotic medicine to help prevent infection. °· You may be given a special warming gown to wear to keep your temperature stable. °· Hair may be removed from your pubic area. °· The skin of your pubic area and lower abdomen will be cleaned with a germ-killing solution (antiseptic). °· A catheter may be inserted into your bladder through your urethra. This drains your urine during the procedure. °· You may be given one or more of the following: °? A medicine to numb the area (local anesthetic). °? A medicine to make you fall asleep (general anesthetic). °? A medicine (regional anesthetic) that is injected into your back or through a small   thin tube placed in your back (spinal anesthetic or epidural anesthetic). This numbs everything below the injection site and allows you to stay awake during your procedure. If this makes you feel nauseous, tell your health care provider. Medicines will be available to help reduce any nausea you may feel. °· An incision will be made in your abdomen, and then in your uterus. °· If you are awake during your  procedure, you may feel tugging and pulling in your abdomen, but you should not feel pain. If you feel pain, tell your health care provider immediately. °· Your baby will be removed from your uterus. You may feel more pressure or pushing while this happens. °· Immediately after birth, your baby will be dried and kept warm. You may be able to hold and breastfeed your baby. The umbilical cord may be clamped and cut during this time. °· Your placenta will be removed from your uterus. °· Your incisions will be closed with stitches (sutures). Staples, skin glue, or adhesive strips may also be applied to the incision in your abdomen. °· Bandages (dressings) will be placed over the incision in your abdomen. °The procedure may vary among health care providers and hospitals. °What happens after the procedure? °· Your blood pressure, heart rate, breathing rate, and blood oxygen level will be monitored often until the medicines you were given have worn off. °· You may continue to receive fluids and medicines through an IV tube. °· You will have some pain. Medicines will be available to help control your pain. °· To help prevent blood clots: °? You may be given medicines. °? You may have to wear compression stockings or devices. °? You will be encouraged to walk around when you are able. °· Hospital staff will encourage and support bonding with your baby. Your hospital may allow you and your baby to stay in the same room (rooming in) during your hospital stay to encourage successful breastfeeding. °· You may be encouraged to cough and breathe deeply often. This helps to prevent lung problems. °· If you have a catheter draining your urine, it will be removed as soon as possible after your procedure. °This information is not intended to replace advice given to you by your health care provider. Make sure you discuss any questions you have with your health care provider. °Document Released: 07/06/2005 Document Revised: 12/12/2015  Document Reviewed: 04/16/2015 °Elsevier Interactive Patient Education © 2017 [employer] ° °

## 2016-07-02 ENCOUNTER — Encounter: Payer: Self-pay | Admitting: Obstetrics and Gynecology

## 2016-07-02 ENCOUNTER — Encounter (HOSPITAL_COMMUNITY)
Admission: RE | Admit: 2016-07-02 | Discharge: 2016-07-02 | Disposition: A | Discharge status: Home / Self Care | Payer: Medicaid Other | Source: Ambulatory Visit | Admitting: Obstetrics and Gynecology

## 2016-07-02 DIAGNOSIS — F1491 Cocaine use, unspecified, in remission: Secondary | ICD-10-CM | POA: Insufficient documentation

## 2016-07-02 DIAGNOSIS — Z87898 Personal history of other specified conditions: Secondary | ICD-10-CM | POA: Insufficient documentation

## 2016-07-02 NOTE — Patient Instructions (Signed)
18 Kelly Gordon  07/02/2016   Your procedure is scheduled on:  07/03/2016  Enter through the [facility] at 1100 AM.  Pick up the phone at the desk and dial [phone].   Call this number if you have problems the morning of surgery: [phone]   Remember:   Do not eat food:After Midnight.  Do not drink clear liquids: After Midnight.  Take these medicines the morning of surgery with A SIP OF WATER: valtrex, if you take it in the morning   Do not wear jewelry, make-up or nail polish.  Do not wear lotions, powders, or perfumes. Do not wear deodorant.  Do not shave 48 hours prior to surgery.  Do not bring valuables to the hospital.  [facility] is not   responsible for any belongings or valuables brought to the hospital.  Contacts, dentures or bridgework may not be worn into surgery.  Leave suitcase in the car. After surgery it may be brought to your room.  For patients admitted to the hospital, checkout time is 11:00 AM the day of              discharge.   Patients discharged the day of surgery will not be allowed to drive             home.  Name and phone number of your driver:na  Special Instructions:   N/A   Please read over the following fact sheets that you were given:   Surgical Site Infection Prevention

## 2016-07-03 ENCOUNTER — Encounter (HOSPITAL_COMMUNITY): Payer: Self-pay | Admitting: *Deleted

## 2016-07-03 ENCOUNTER — Inpatient Hospital Stay (HOSPITAL_COMMUNITY): Payer: Medicaid Other | Admitting: Anesthesiology

## 2016-07-03 ENCOUNTER — Other Ambulatory Visit: Payer: Self-pay | Admitting: Obstetrics and Gynecology

## 2016-07-03 ENCOUNTER — Encounter (HOSPITAL_COMMUNITY)
Admission: RE | Disposition: A | Discharge status: Home / Self Care | Payer: Self-pay | Source: Ambulatory Visit | Attending: Obstetrics and Gynecology

## 2016-07-03 ENCOUNTER — Inpatient Hospital Stay (HOSPITAL_COMMUNITY)
Admission: RE | Admit: 2016-07-03 | Discharge: 2016-07-06 | DRG: 765 | Disposition: A | Discharge status: Home / Self Care | Payer: Medicaid Other | Source: Ambulatory Visit | Attending: Obstetrics and Gynecology | Admitting: Obstetrics and Gynecology

## 2016-07-03 DIAGNOSIS — O99824 Streptococcus B carrier state complicating childbirth: Secondary | ICD-10-CM | POA: Diagnosis not present

## 2016-07-03 DIAGNOSIS — O34219 Maternal care for unspecified type scar from previous cesarean delivery: Secondary | ICD-10-CM | POA: Diagnosis present

## 2016-07-03 DIAGNOSIS — F1490 Cocaine use, unspecified, uncomplicated: Secondary | ICD-10-CM | POA: Diagnosis present

## 2016-07-03 DIAGNOSIS — Z87891 Personal history of nicotine dependence: Secondary | ICD-10-CM | POA: Diagnosis not present

## 2016-07-03 DIAGNOSIS — A6000 Herpesviral infection of urogenital system, unspecified: Secondary | ICD-10-CM | POA: Diagnosis not present

## 2016-07-03 DIAGNOSIS — O99324 Drug use complicating childbirth: Secondary | ICD-10-CM | POA: Diagnosis present

## 2016-07-03 DIAGNOSIS — Z3A39 39 weeks gestation of pregnancy: Secondary | ICD-10-CM | POA: Diagnosis not present

## 2016-07-03 DIAGNOSIS — O9832 Other infections with a predominantly sexual mode of transmission complicating childbirth: Secondary | ICD-10-CM | POA: Diagnosis not present

## 2016-07-03 DIAGNOSIS — K5900 Constipation, unspecified: Secondary | ICD-10-CM | POA: Diagnosis not present

## 2016-07-03 DIAGNOSIS — Z302 Encounter for sterilization: Secondary | ICD-10-CM | POA: Diagnosis not present

## 2016-07-03 DIAGNOSIS — O34211 Maternal care for low transverse scar from previous cesarean delivery: Principal | ICD-10-CM | POA: Diagnosis present

## 2016-07-03 LAB — CBC
HEMATOCRIT: 29.4 % — AB (ref 36.0–46.0)
HEMOGLOBIN: 9.6 g/dL — AB (ref 12.0–15.0)
MCH: 27 pg (ref 26.0–34.0)
MCHC: 32.7 g/dL (ref 30.0–36.0)
MCV: 82.8 fL (ref 78.0–100.0)
Platelets: 171 10*3/uL (ref 150–400)
RBC: 3.55 MIL/uL — ABNORMAL LOW (ref 3.87–5.11)
RDW: 15 % (ref 11.5–15.5)
WBC: 6.7 10*3/uL (ref 4.0–10.5)

## 2016-07-03 LAB — RAPID URINE DRUG SCREEN, HOSP PERFORMED
Amphetamines: NOT DETECTED
Barbiturates: NOT DETECTED
Benzodiazepines: NOT DETECTED
Cocaine: NOT DETECTED
OPIATES: NOT DETECTED
Tetrahydrocannabinol: NOT DETECTED

## 2016-07-03 LAB — ABO/RH: ABO/RH(D): O POS

## 2016-07-03 LAB — TYPE AND SCREEN
ABO/RH(D): O POS
Antibody Screen: NEGATIVE

## 2016-07-03 SURGERY — Surgical Case
Anesthesia: Monitor Anesthesia Care | Site: Abdomen | Wound class: Clean Contaminated

## 2016-07-03 MED ORDER — BUPIVACAINE IN DEXTROSE 0.75-8.25 % IT SOLN
INTRATHECAL | Status: DC | PRN
  Administered 2016-07-03: 1.6 mL via INTRATHECAL

## 2016-07-03 MED ORDER — WITCH HAZEL-GLYCERIN EX PADS: 1.0000 "application " | MEDICATED_PAD | CUTANEOUS | Status: DC | PRN

## 2016-07-03 MED ORDER — HYDROMORPHONE HCL 1 MG/ML IJ SOLN: 0.2500 mg | INTRAMUSCULAR | Status: DC | PRN

## 2016-07-03 MED ORDER — PHENYLEPHRINE 40 MCG/ML (10ML) SYRINGE FOR IV PUSH (FOR BLOOD PRESSURE SUPPORT)
PREFILLED_SYRINGE | INTRAVENOUS | Status: AC
  Filled 2016-07-03: qty 10

## 2016-07-03 MED ORDER — SOD CITRATE-CITRIC ACID 500-334 MG/5ML PO SOLN: 30.0000 mL | ORAL | Status: DC

## 2016-07-03 MED ORDER — SODIUM CHLORIDE 0.9% FLUSH: 3.0000 mL | INTRAVENOUS | Status: DC | PRN

## 2016-07-03 MED ORDER — COCONUT OIL OIL: 1.0000 "application " | TOPICAL_OIL | Status: DC | PRN

## 2016-07-03 MED ORDER — DIBUCAINE 1 % RE OINT: 1.0000 "application " | TOPICAL_OINTMENT | RECTAL | Status: DC | PRN

## 2016-07-03 MED ORDER — NALBUPHINE HCL 10 MG/ML IJ SOLN
5.0000 mg | Freq: Once | INTRAMUSCULAR | Status: AC | PRN
  Administered 2016-07-03: 5 mg via INTRAVENOUS

## 2016-07-03 MED ORDER — DIPHENHYDRAMINE HCL 25 MG PO CAPS: 25.0000 mg | ORAL_CAPSULE | ORAL | Status: DC | PRN

## 2016-07-03 MED ORDER — MEPERIDINE HCL 25 MG/ML IJ SOLN: 6.2500 mg | INTRAMUSCULAR | Status: DC | PRN

## 2016-07-03 MED ORDER — DIPHENHYDRAMINE HCL 25 MG PO CAPS
25.0000 mg | ORAL_CAPSULE | ORAL | Status: DC | PRN
  Filled 2016-07-03: qty 1

## 2016-07-03 MED ORDER — MENTHOL 3 MG MT LOZG: 1.0000 | LOZENGE | OROMUCOSAL | Status: DC | PRN

## 2016-07-03 MED ORDER — KETOROLAC TROMETHAMINE 30 MG/ML IJ SOLN
30.0000 mg | Freq: Four times a day (QID) | INTRAMUSCULAR | Status: DC | PRN
  Administered 2016-07-03: 30 mg via INTRAMUSCULAR

## 2016-07-03 MED ORDER — ONDANSETRON HCL 4 MG/2ML IJ SOLN: 4.0000 mg | Freq: Three times a day (TID) | INTRAMUSCULAR | Status: DC | PRN

## 2016-07-03 MED ORDER — LACTATED RINGERS IV SOLN
INTRAVENOUS | Status: DC
  Administered 2016-07-03: 13:00:00 via INTRAVENOUS

## 2016-07-03 MED ORDER — NALOXONE HCL 0.4 MG/ML IJ SOLN
0.4000 mg | INTRAMUSCULAR | Status: DC | PRN
Start: 2016-07-03 — End: 2016-07-03

## 2016-07-03 MED ORDER — SIMETHICONE 80 MG PO CHEW: 80.0000 mg | CHEWABLE_TABLET | ORAL | Status: DC | PRN

## 2016-07-03 MED ORDER — LACTATED RINGERS IV SOLN
INTRAVENOUS | Status: DC
  Administered 2016-07-04: 01:00:00 via INTRAVENOUS

## 2016-07-03 MED ORDER — PHENYLEPHRINE HCL 10 MG/ML IJ SOLN
INTRAMUSCULAR | Status: DC | PRN
  Administered 2016-07-03: 40 ug via INTRAVENOUS

## 2016-07-03 MED ORDER — EPHEDRINE SULFATE 50 MG/ML IJ SOLN
INTRAMUSCULAR | Status: DC | PRN
  Administered 2016-07-03: 10 mg via INTRAVENOUS

## 2016-07-03 MED ORDER — METOCLOPRAMIDE HCL 5 MG/ML IJ SOLN
INTRAMUSCULAR | Status: AC
  Filled 2016-07-03: qty 2

## 2016-07-03 MED ORDER — SCOPOLAMINE 1 MG/3DAYS TD PT72
1.0000 | MEDICATED_PATCH | Freq: Once | TRANSDERMAL | Status: DC
  Filled 2016-07-03: qty 1

## 2016-07-03 MED ORDER — NALBUPHINE HCL 10 MG/ML IJ SOLN
5.0000 mg | INTRAMUSCULAR | Status: DC | PRN
  Administered 2016-07-03 – 2016-07-04 (×3): 5 mg via SUBCUTANEOUS
  Filled 2016-07-03 (×3): qty 1

## 2016-07-03 MED ORDER — LACTATED RINGERS IV SOLN
INTRAVENOUS | Status: DC | PRN
  Administered 2016-07-03 (×2): 40 [IU] via INTRAVENOUS

## 2016-07-03 MED ORDER — SIMETHICONE 80 MG PO CHEW
80.0000 mg | CHEWABLE_TABLET | ORAL | Status: DC
  Administered 2016-07-04 – 2016-07-06 (×3): 80 mg via ORAL
  Filled 2016-07-03 (×3): qty 1

## 2016-07-03 MED ORDER — ACETAMINOPHEN 500 MG PO TABS
1000.0000 mg | ORAL_TABLET | Freq: Four times a day (QID) | ORAL | Status: DC
  Administered 2016-07-03 – 2016-07-06 (×10): 1000 mg via ORAL
  Filled 2016-07-03 (×12): qty 2

## 2016-07-03 MED ORDER — ONDANSETRON HCL 4 MG/2ML IJ SOLN
INTRAMUSCULAR | Status: AC
  Filled 2016-07-03: qty 2

## 2016-07-03 MED ORDER — NALOXONE HCL 2 MG/2ML IJ SOSY
1.0000 ug/kg/h | PREFILLED_SYRINGE | INTRAMUSCULAR | Status: DC | PRN
  Filled 2016-07-03: qty 2

## 2016-07-03 MED ORDER — EPHEDRINE SULFATE 50 MG/ML IJ SOLN
INTRAMUSCULAR | Status: AC
  Filled 2016-07-03: qty 1

## 2016-07-03 MED ORDER — FENTANYL CITRATE (PF) 100 MCG/2ML IJ SOLN
INTRAMUSCULAR | Status: DC | PRN
  Administered 2016-07-03: 10 ug via INTRAVENOUS

## 2016-07-03 MED ORDER — MORPHINE SULFATE-NACL 0.5-0.9 MG/ML-% IV SOSY
PREFILLED_SYRINGE | INTRAVENOUS | Status: AC
  Filled 2016-07-03: qty 1

## 2016-07-03 MED ORDER — SIMETHICONE 80 MG PO CHEW
80.0000 mg | CHEWABLE_TABLET | Freq: Three times a day (TID) | ORAL | Status: DC
  Administered 2016-07-04 – 2016-07-06 (×6): 80 mg via ORAL
  Filled 2016-07-03 (×7): qty 1

## 2016-07-03 MED ORDER — MORPHINE SULFATE (PF) 0.5 MG/ML IJ SOLN
INTRAMUSCULAR | Status: DC | PRN
  Administered 2016-07-03: .2 mg via EPIDURAL

## 2016-07-03 MED ORDER — DIPHENHYDRAMINE HCL 50 MG/ML IJ SOLN: 12.5000 mg | INTRAMUSCULAR | Status: DC | PRN

## 2016-07-03 MED ORDER — IBUPROFEN 600 MG PO TABS
600.0000 mg | ORAL_TABLET | Freq: Four times a day (QID) | ORAL | Status: DC
  Administered 2016-07-03 – 2016-07-05 (×7): 600 mg via ORAL
  Filled 2016-07-03 (×7): qty 1

## 2016-07-03 MED ORDER — OXYCODONE HCL 5 MG PO TABS
10.0000 mg | ORAL_TABLET | ORAL | Status: DC | PRN
  Administered 2016-07-04 – 2016-07-06 (×8): 10 mg via ORAL
  Filled 2016-07-03 (×7): qty 2

## 2016-07-03 MED ORDER — TETANUS-DIPHTH-ACELL PERTUSSIS 5-2.5-18.5 LF-MCG/0.5 IM SUSP: 0.5000 mL | Freq: Once | INTRAMUSCULAR | Status: DC

## 2016-07-03 MED ORDER — OXYTOCIN 40 UNITS IN LACTATED RINGERS INFUSION - SIMPLE MED: 2.5000 [IU]/h | INTRAVENOUS | Status: AC

## 2016-07-03 MED ORDER — OXYTOCIN 10 UNIT/ML IJ SOLN
INTRAMUSCULAR | Status: AC
  Filled 2016-07-03: qty 4

## 2016-07-03 MED ORDER — KETOROLAC TROMETHAMINE 30 MG/ML IJ SOLN: 30.0000 mg | Freq: Four times a day (QID) | INTRAMUSCULAR | Status: AC | PRN

## 2016-07-03 MED ORDER — FENTANYL CITRATE (PF) 100 MCG/2ML IJ SOLN
INTRAMUSCULAR | Status: AC
  Filled 2016-07-03: qty 2

## 2016-07-03 MED ORDER — DIPHENHYDRAMINE HCL 25 MG PO CAPS: 25.0000 mg | ORAL_CAPSULE | Freq: Four times a day (QID) | ORAL | Status: DC | PRN

## 2016-07-03 MED ORDER — SCOPOLAMINE 1 MG/3DAYS TD PT72
MEDICATED_PATCH | TRANSDERMAL | Status: AC
  Administered 2016-07-03: 1.5 mg via TRANSDERMAL
  Filled 2016-07-03: qty 1

## 2016-07-03 MED ORDER — PHENYLEPHRINE 8 MG IN D5W 100 ML (0.08MG/ML) PREMIX OPTIME
INJECTION | INTRAVENOUS | Status: AC
  Filled 2016-07-03: qty 100

## 2016-07-03 MED ORDER — NALOXONE HCL 2 MG/2ML IJ SOSY
1.0000 ug/kg/h | PREFILLED_SYRINGE | INTRAVENOUS | Status: DC | PRN
  Filled 2016-07-03: qty 2

## 2016-07-03 MED ORDER — ONDANSETRON HCL 4 MG/2ML IJ SOLN
INTRAMUSCULAR | Status: DC | PRN
  Administered 2016-07-03: 4 mg via INTRAVENOUS

## 2016-07-03 MED ORDER — SOD CITRATE-CITRIC ACID 500-334 MG/5ML PO SOLN
30.0000 mL | ORAL | Status: DC
  Filled 2016-07-03: qty 30

## 2016-07-03 MED ORDER — SODIUM CHLORIDE 0.9 % IR SOLN
Status: DC | PRN
  Administered 2016-07-03: 1

## 2016-07-03 MED ORDER — SCOPOLAMINE 1 MG/3DAYS TD PT72
1.0000 | MEDICATED_PATCH | Freq: Once | TRANSDERMAL | Status: DC
  Administered 2016-07-03: 1.5 mg via TRANSDERMAL

## 2016-07-03 MED ORDER — ZOLPIDEM TARTRATE 5 MG PO TABS: 5.0000 mg | ORAL_TABLET | Freq: Every evening | ORAL | Status: DC | PRN

## 2016-07-03 MED ORDER — NALBUPHINE HCL 10 MG/ML IJ SOLN: 5.0000 mg | Freq: Once | INTRAMUSCULAR | Status: DC | PRN

## 2016-07-03 MED ORDER — NALBUPHINE HCL 10 MG/ML IJ SOLN: 5.0000 mg | INTRAMUSCULAR | Status: DC | PRN

## 2016-07-03 MED ORDER — SCOPOLAMINE 1 MG/3DAYS TD PT72: 1.0000 | MEDICATED_PATCH | Freq: Once | TRANSDERMAL | Status: DC

## 2016-07-03 MED ORDER — PHENYLEPHRINE 8 MG IN D5W 100 ML (0.08MG/ML) PREMIX OPTIME
INJECTION | INTRAVENOUS | Status: DC | PRN
  Administered 2016-07-03: 60 ug/min via INTRAVENOUS

## 2016-07-03 MED ORDER — SENNOSIDES-DOCUSATE SODIUM 8.6-50 MG PO TABS
2.0000 | ORAL_TABLET | ORAL | Status: DC
  Administered 2016-07-04 – 2016-07-06 (×3): 2 via ORAL
  Filled 2016-07-03 (×3): qty 2

## 2016-07-03 MED ORDER — LACTATED RINGERS IV SOLN
Freq: Once | INTRAVENOUS | Status: AC
  Administered 2016-07-03 (×3): via INTRAVENOUS

## 2016-07-03 MED ORDER — ONDANSETRON HCL 4 MG/2ML IJ SOLN
4.0000 mg | Freq: Three times a day (TID) | INTRAMUSCULAR | Status: DC | PRN
  Administered 2016-07-03: 4 mg via INTRAVENOUS

## 2016-07-03 MED ORDER — PRENATAL MULTIVITAMIN CH
1.0000 | ORAL_TABLET | Freq: Every day | ORAL | Status: DC
  Administered 2016-07-04 – 2016-07-05 (×2): 1 via ORAL
  Filled 2016-07-03 (×2): qty 1

## 2016-07-03 MED ORDER — METOCLOPRAMIDE HCL 5 MG/ML IJ SOLN
INTRAMUSCULAR | Status: DC | PRN
  Administered 2016-07-03: 5 mg via INTRAVENOUS

## 2016-07-03 MED ORDER — NALBUPHINE HCL 10 MG/ML IJ SOLN: 5.0000 mg | Freq: Once | INTRAMUSCULAR | Status: AC | PRN

## 2016-07-03 MED ORDER — OXYCODONE HCL 5 MG PO TABS
5.0000 mg | ORAL_TABLET | ORAL | Status: DC | PRN
  Administered 2016-07-04: 5 mg via ORAL
  Filled 2016-07-03 (×3): qty 1

## 2016-07-03 MED ORDER — NALOXONE HCL 0.4 MG/ML IJ SOLN: 0.4000 mg | INTRAMUSCULAR | Status: DC | PRN

## 2016-07-03 MED ORDER — CEFAZOLIN SODIUM-DEXTROSE 2-4 GM/100ML-% IV SOLN
2.0000 g | INTRAVENOUS | Status: AC
  Administered 2016-07-03: 2 g via INTRAVENOUS
  Filled 2016-07-03: qty 100

## 2016-07-03 MED ORDER — ACETAMINOPHEN 325 MG PO TABS: 650.0000 mg | ORAL_TABLET | ORAL | Status: DC | PRN

## 2016-07-03 MED ORDER — KETOROLAC TROMETHAMINE 30 MG/ML IJ SOLN: 30.0000 mg | Freq: Four times a day (QID) | INTRAMUSCULAR | Status: DC | PRN

## 2016-07-03 MED ORDER — KETOROLAC TROMETHAMINE 30 MG/ML IJ SOLN
INTRAMUSCULAR | Status: AC
  Filled 2016-07-03: qty 1

## 2016-07-03 MED ORDER — NALBUPHINE HCL 10 MG/ML IJ SOLN
5.0000 mg | INTRAMUSCULAR | Status: DC | PRN
  Filled 2016-07-03: qty 1

## 2016-07-03 MED ORDER — DEXTROSE 5 % IV SOLN: 2.0000 g | INTRAVENOUS | Status: DC

## 2016-07-03 MED ORDER — PROMETHAZINE HCL 25 MG/ML IJ SOLN: 6.2500 mg | INTRAMUSCULAR | Status: DC | PRN

## 2016-07-03 SURGICAL SUPPLY — 33 items
ADH SKN CLS APL DERMABOND .7 (GAUZE/BANDAGES/DRESSINGS) ×1
CANISTER SUCT 3000ML PPV (MISCELLANEOUS) ×3 IMPLANT
CHLORAPREP W/TINT 26ML (MISCELLANEOUS) ×3 IMPLANT
DERMABOND ADVANCED (GAUZE/BANDAGES/DRESSINGS) ×2
DERMABOND ADVANCED .7 DNX12 (GAUZE/BANDAGES/DRESSINGS) ×1 IMPLANT
DRSG OPSITE POSTOP 4X10 (GAUZE/BANDAGES/DRESSINGS) ×3 IMPLANT
ELECT REM PT RETURN 9FT ADLT (ELECTROSURGICAL) ×3
ELECTRODE REM PT RTRN 9FT ADLT (ELECTROSURGICAL) ×1 IMPLANT
GLOVE BIOGEL PI IND STRL 7.0 (GLOVE) ×2 IMPLANT
GLOVE BIOGEL PI IND STRL 7.5 (GLOVE) ×1 IMPLANT
GLOVE BIOGEL PI INDICATOR 7.0 (GLOVE) ×4
GLOVE BIOGEL PI INDICATOR 7.5 (GLOVE) ×2
GLOVE SKINSENSE NS SZ7.0 (GLOVE) ×2
GLOVE SKINSENSE STRL SZ7.0 (GLOVE) ×1 IMPLANT
GOWN STRL REUS W/ TWL LRG LVL3 (GOWN DISPOSABLE) ×2 IMPLANT
GOWN STRL REUS W/ TWL XL LVL3 (GOWN DISPOSABLE) ×1 IMPLANT
GOWN STRL REUS W/TWL LRG LVL3 (GOWN DISPOSABLE) ×6
GOWN STRL REUS W/TWL XL LVL3 (GOWN DISPOSABLE) ×3
NS IRRIG 1000ML POUR BTL (IV SOLUTION) ×3 IMPLANT
PACK C SECTION WH (CUSTOM PROCEDURE TRAY) ×3 IMPLANT
PAD OB MATERNITY 4.3X12.25 (PERSONAL CARE ITEMS) ×3 IMPLANT
PAD PREP 24X48 CUFFED NSTRL (MISCELLANEOUS) ×3 IMPLANT
SUT MON AB 4-0 PS1 27 (SUTURE) ×3 IMPLANT
SUT MON AB-0 CT1 36 (SUTURE) ×6 IMPLANT
SUT PLAIN 2 0 (SUTURE) ×3
SUT PLAIN ABS 2-0 CT1 27XMFL (SUTURE) ×1 IMPLANT
SUT VIC AB 0 CT1 36 (SUTURE) ×6 IMPLANT
SUT VIC AB 1 CT1 27 (SUTURE) ×3
SUT VIC AB 1 CT1 27XBRD ANTBC (SUTURE) IMPLANT
SUT VIC AB 1 CT1 36 (SUTURE) ×2 IMPLANT
SUT VIC AB 2-0 CT1 (SUTURE) ×4 IMPLANT
SUT VIC AB 3-0 CT1 27 (SUTURE) ×3
SUT VIC AB 3-0 CT1 TAPERPNT 27 (SUTURE) ×1 IMPLANT

## 2016-07-03 NOTE — H&P (Addendum)
Obstetrics Admission History & Physical  07/03/2016 - 12:32 PM Primary OBGYN: [facility]  Chief Complaint: scheduled surgery  History of Present Illness  25 y.o. [id number] @ 171w0d, with the above CC. Pregnancy complicated by: h/o c-section x 1, h/o HSV, h/o +UDS for cocaine and benzo at [facility].  [name] states denies any labor s/s or decreased FM. She also denies any prodromal s/s.   Review of Systems:  her 12 point review of systems is negative or as noted in the History of Present Illness.  PMHx:  Past Medical History:  Diagnosis Date  . History of cocaine use    12/2015 [facility] UDS  . Vaginal delivery 2009   PSHx:  Past Surgical History:  Procedure Laterality Date  . CESAREAN SECTION     Medications:  Prescriptions Prior to Admission  Medication Sig Dispense Refill Last Dose  . Prenatal Vit-Fe Fumarate-FA (PRENATAL MULTIVITAMIN) TABS tablet Take 1 tablet by mouth daily at 12 noon.   07/02/2016 at Unknown time  . valACYclovir (VALTREX) 500 MG tablet Take 1 tablet (500 mg total) by mouth 2 (two) times daily. 45 tablet 0 07/02/2016 at Unknown time     Allergies: has No Known Allergies. OBHx:  OB History  Gravida Para Term Preterm AB Living  5 2 1 1 2 2   SAB TAB Ectopic Multiple Live Births  2       2    # Outcome Date GA Lbr Len/2nd Weight Sex Delivery Anes PTL Lv  5 Current           4 Preterm 04/18/11 4817w5d  2.073 kg (4 lb 9.1 oz) M CS-LTranv Spinal  LIV  3 Term 10/03/07 7041w0d  2.977 kg (6 lb 9 oz) M Vag-Spont  N LIV  2 SAB           1 SAB              GYNHx:  As per HPI         FHx: History reviewed. No pertinent family history. Soc Hx:  Social History   Social History  . Marital status: Married    Spouse name: N/A  . Number of children: N/A  . Years of education: N/A   Occupational History  . Not on file.   Social History Main Topics  . Smoking status: Former Smoker    Packs/day: 0.10    Types: Cigarettes  . Smokeless  tobacco: Never Used  . Alcohol use No     Comment: socially  . Drug use: No  . Sexual activity: Yes   Other Topics Concern  . Not on file   Social History Narrative  . No narrative on file    Objective    Current Vital Signs 24h Vital Sign Ranges  T 98.1 F (36.7 C) Temp  Avg: 98.1 F (36.7 C)  Min: 98.1 F (36.7 C)  Max: 98.1 F (36.7 C)  BP (!) 108/59 BP  Min: 108/59  Max: 108/59  HR 70 Pulse  Avg: 70  Min: 70  Max: 70  RR 18 Resp  Avg: 18  Min: 18  Max: 18  SaO2 100 % Not Delivered SpO2  Avg: 100 %  Min: 100 %  Max: 100 %       24 Hour I/O Current Shift I/O  Time Ins Outs No intake/output data recorded. No intake/output data recorded.   General: Well nourished, well developed female in no acute distress.  Skin:  Warm and dry.  Cardiovascular: S1, S2 normal, no murmur, rub or gallop, regular rate and rhythm Respiratory:  Clear to auscultation bilateral. Normal respiratory effort Abdomen: gravid, nttp Neuro/Psych:  Normal mood and affect.    Labs  ABO and UDS pending  Recent Labs Lab 07/03/16 1130  WBC 6.7  HGB 9.6*  HCT 29.4*  PLT 171   Radiology Posterior placenta   Perinatal info  O pos/ Rubella  Immune / Varicella Immune/RPR pending/HIV Negative/HepB Surf Ag Negative/TDaP:UTD /pap neg 2015/  Assessment & Plan   25 y.o. [id number] @ 7348w0d here for scheduled surgery and doing well *Pregnancy: fetal status reassuring *h/o c-section: pt desires rpt. Consented for it after r/b/a d/w pt *BC: pt desires BTL. Papers UTD. R/b/a d/w pt and she would like BTL *h/o +UDS. Pt unsure how she was + back then. D/w her rationale for it and she is amenable to it.  *GBS: pos *h/o HSV: d/c valtrex PP  Can proceed when OR is ready.   Cornelia Copaharlie Jaelani Posa, Jr. MD Attending [facility](Faculty Practice)

## 2016-07-03 NOTE — Progress Notes (Signed)
UR chart review completed.  

## 2016-07-03 NOTE — Addendum Note (Signed)
Addendum  created 07/03/16 1954 by Graciela HusbandsWynn O Adham Johnson, CRNA   Sign clinical note

## 2016-07-03 NOTE — Transfer of Care (Signed)
Immediate Anesthesia Transfer of Care Note  Patient: Kelly Gordon  Procedure(s) Performed: Procedure(s): CESAREAN SECTION with BTL (N/A)  Patient Location: PACU  Anesthesia Type:Spinal  Level of Consciousness: awake and alert   Airway & Oxygen Therapy: Patient Spontanous Breathing  Post-op Assessment: Report given to RN and Post -op Vital signs reviewed and stable  Post vital signs: Reviewed  Last Vitals:  Vitals:   07/03/16 1213  BP: (!) 108/59  Pulse: 70  Resp: 18  Temp: 36.7 C    Last Pain:  Vitals:   07/03/16 1213  TempSrc: Oral      Patients Stated Pain Goal: 4 (07/03/16 1213)  Complications: No apparent anesthesia complications

## 2016-07-03 NOTE — Addendum Note (Signed)
Addendum  created 07/03/16 1655 by Heather RobertsJames Tremayne Sheldon, MD   Order list changed, Order sets accessed

## 2016-07-03 NOTE — Anesthesia Postprocedure Evaluation (Signed)
Anesthesia Post Note  Patient: Kelly Gordon  Procedure(s) Performed: Procedure(s) (LRB): CESAREAN SECTION with BTL (N/A)  Patient location during evaluation: PACU Anesthesia Type: Spinal and MAC Level of consciousness: awake and alert Pain management: pain level controlled Vital Signs Assessment: post-procedure vital signs reviewed and stable Respiratory status: spontaneous breathing and respiratory function stable Cardiovascular status: blood pressure returned to baseline and stable Postop Assessment: spinal receding Anesthetic complications: no     Last Vitals:  Vitals:   07/03/16 1530 07/03/16 1531  BP:  96/62  Pulse: (!) 52 (!) 51  Resp: 12 12  Temp:      Last Pain:  Vitals:   07/03/16 1213  TempSrc: Oral   Pain Goal: Patients Stated Pain Goal: 4 (07/03/16 1213)               [name] DANIEL

## 2016-07-03 NOTE — Anesthesia Preprocedure Evaluation (Addendum)
Anesthesia Evaluation  Patient identified by MRN, date of birth, ID band Patient awake    Reviewed: Allergy & Precautions, H&P , NPO status , Patient's Chart, lab work & pertinent test results, reviewed documented beta blocker date and time   History of Anesthesia Complications Negative for: history of anesthetic complications  Airway Mallampati: I  TM Distance: >3 FB Neck ROM: full    Dental no notable dental hx. (+) Dental Advisory Given,    Pulmonary former smoker,    Pulmonary exam normal        Cardiovascular negative cardio ROS Normal cardiovascular exam     Neuro/Psych negative neurological ROS  negative psych ROS   GI/Hepatic negative GI ROS, Neg liver ROS,   Endo/Other  negative endocrine ROS  Renal/GU negative Renal ROS     Musculoskeletal   Abdominal   Peds  Hematology negative hematology ROS (+)   Anesthesia Other Findings NPO since 8 pm - pizza and water  Reproductive/Obstetrics (+) Pregnancy (active HSV in labor)                            Anesthesia Physical  Anesthesia Plan  ASA: II  Anesthesia Plan: Spinal and MAC   Post-op Pain Management:    Induction:   Airway Management Planned:   Additional Equipment:   Intra-op Plan:   Post-operative Plan:   Informed Consent: I have reviewed the patients History and Physical, chart, labs and discussed the procedure including the risks, benefits and alternatives for the proposed anesthesia with the patient or authorized representative who has indicated his/her understanding and acceptance.   Dental advisory given  Plan Discussed with: CRNA, Surgeon and Anesthesiologist  Anesthesia Plan Comments:        Anesthesia Quick Evaluation

## 2016-07-03 NOTE — Anesthesia Postprocedure Evaluation (Signed)
Anesthesia Post Note  Patient: Kelly Gordon  Procedure(s) Performed: Procedure(s) (LRB): CESAREAN SECTION with BTL (N/A)  Patient location during evaluation: Mother Baby Anesthesia Type: Spinal Level of consciousness: awake and alert and oriented Pain management: satisfactory to patient Vital Signs Assessment: post-procedure vital signs reviewed and stable Respiratory status: respiratory function stable and spontaneous breathing Cardiovascular status: blood pressure returned to baseline Postop Assessment: no headache, no backache, spinal receding, patient able to bend at knees and adequate PO intake Anesthetic complications: no     Last Vitals:  Vitals:   07/03/16 1833 07/03/16 1900  BP:  (!) 106/58  Pulse:  (!) 48  Resp:  16  Temp: 36.7 C 36.7 C    Last Pain:  Vitals:   07/03/16 1900  TempSrc: Oral  PainSc: 6    Pain Goal: Patients Stated Pain Goal: 4 (07/03/16 1213)               Rakesh Dutko

## 2016-07-03 NOTE — Op Note (Signed)
Operative Note   SURGERY DATE: 07/03/2016  PRE-OP DIAGNOSIS:  Pregnancy @ 39 0/7 weeks Desired permanent sterilization  POST-OP DIAGNOSIS: Same   PROCEDURE: Repeat low transverse cesarean section via pfannenstiel skin incision with double layer uterine closure and Parkland method tubal ligation bilaterally  SURGEON: Surgeon(s) and Role:    * Shamrock Bingharlie Albirtha Grinage, MD - Primary  ASSISTANT: Jen MowElizabeth Mumaw, DO  ANESTHESIA: spinal  ESTIMATED BLOOD LOSS: 600 mL  DRAINS: 200 mL UOP via indwelling foley  TOTAL IV FLUIDS: 2600 mL crystalloid  VTE Prophylaxis: SCDs  ANTIBIOTICS: Two grams of Cefazolin were given., within 1 hour of skin incision  SPECIMENS: Sections of right and left fallopian tube  COMPLICATIONS: None  FINDINGS: A single omental adhesion to fundus of uterus. Grossly normal uterus, tubes and ovaries. Clear amniotic fluid, cephalic female infant, weight 2690g, APGARs 8/9, intact placenta.  PROCEDURE IN DETAIL: The patient was taken to the operating room where anesthesia was administered and normal fetal heart tones were confirmed. She was then prepped and draped in the normal fashion in the dorsal supine position with a leftward tilt.  After a time out was performed, a pfannensteil low transverse skin incision was made with the scalpel and carried through to the underlying layer of fascia. The fascia was then incised at the midline and this incision was extended laterally with the mayo scissors. Attention was turned to the superior aspect of the fascial incision which was grasped with the kocher clamps x 2, tented up and the rectus muscles were dissected off with the scalpel and bovi. In a similar fashion the inferior aspect of the fascial incision was grasped with the kocher clamps, tented up and the rectus muscles dissected off with the mayo scissors. The rectus muscles were then separated in the midline and the peritoneum was entered bluntly. The bladder blade was inserted and  the vesicouterine peritoneum was identified, tented up and entered with the metzenbaum scissors. This incision was extended laterally and the bladder flap was created digitally. The bladder blade was reinserted.  A low transverse hysterotomy was made with the scalpel until the endometrial cavity was breached and the amniotic sac ruptured with the Allis clamp, yielding clear amniotic fluid. This incision was extended bluntly and the infant's head, shoulders and body were delivered atraumatically.The cord was clamped x 2 and cut, and the infant was handed to the awaiting pediatricians, after delayed cord clamping was done for 60 sec.  The placenta was then gradually expressed from the uterus and then the uterus was exteriorized and cleared of all clots and debris. The hysterotomy was repaired with a running suture of 0 Monocryl. A second imbricating layer of 0 Monocryl suture was then placed. Excellent hemostasis was achieved.   Attention was turned to the omental adhesion to the uterine fundus. The omentum was clamped x2 with Kelly clamps, Mayo scissors used to cut in between. 1-0 Vicryl sutures were used suture ligate the ends; good hemostasis was noted.   The left Fallopian tube was identified by tracing out to the fimbraie, grasped with the Babcock clamps. An avascular midsection of the tube approximately 3-4cm from the cornua was grasped with the babcock clamps and the distal and proximal aspects were ligated with a suture of 1-0 Vicryl, with the intervening portion of tube was transected and removed, via the Metzenbaum scissors.  Attention was then turned to the right fallopian tube after confirmation by tracing the tube out to the fimbriae. The same procedure was then performed on the  right Fallopian tube, with excellent hemostasis was noted from both BTL sites.    The uterus and adnexa were then returned to the abdomen, and the hysterotomy and all operative sites were reinspected, including the BTL  sites and excellent hemostasis was noted after irrigation and suction of the abdomen with warm saline.  The fascia was reapproximated with 0 Vicryl in a simple running fashion. The subcutaneous layer was then reapproximated with interrupted sutures of 2-0 plain gut, and the skin was then closed with 4-0 monocryl, in a subcuticular fashion.  The patient  tolerated the procedure well. Sponge, lap, needle, and instrument counts were correct x 2. The patient was transferred to the recovery room awake, alert and breathing independently in stable condition.    Cleda ClarksElizabeth W Mumaw, DO OB Fellow [facility](Faculty Practice)  Agree with above. I was present and scrubbed for the entire procedure.   Cornelia Copaharlie Yug Loria, Jr MD Attending [facility](Faculty Practice)

## 2016-07-04 ENCOUNTER — Encounter (HOSPITAL_COMMUNITY): Payer: Self-pay | Admitting: Obstetrics and Gynecology

## 2016-07-04 LAB — CBC
HCT: 25.4 % — ABNORMAL LOW (ref 36.0–46.0)
Hemoglobin: 8.4 g/dL — ABNORMAL LOW (ref 12.0–15.0)
MCH: 27.4 pg (ref 26.0–34.0)
MCHC: 33.1 g/dL (ref 30.0–36.0)
MCV: 82.7 fL (ref 78.0–100.0)
PLATELETS: 137 10*3/uL — AB (ref 150–400)
RBC: 3.07 MIL/uL — AB (ref 3.87–5.11)
RDW: 14.9 % (ref 11.5–15.5)
WBC: 9.5 10*3/uL (ref 4.0–10.5)

## 2016-07-04 LAB — RPR: RPR Ser Ql: NONREACTIVE

## 2016-07-04 MED ORDER — FERROUS FUMARATE 324 (106 FE) MG PO TABS
1.0000 | ORAL_TABLET | Freq: Two times a day (BID) | ORAL | Status: DC
  Administered 2016-07-04 – 2016-07-06 (×5): 106 mg via ORAL
  Filled 2016-07-04 (×5): qty 1

## 2016-07-04 NOTE — Clinical Social Work Maternal (Signed)
  CLINICAL SOCIAL WORK MATERNAL/CHILD NOTE  Patient Details  Name: Kelly Gordon MRN: [id number] Date of Birth: 01/20/1991  Date:  07/04/2016  Clinical Social Worker Initiating Note:  Ferdinand Lango Alyssandra Hulsebus, MSW, LCSW-A  Date/ Time Initiated:  07/04/16/1133     Child's Name:  Christ Kick.    Legal Guardian:  Other (Comment) (Not established by court system; MOB and FOB parent collectively )   Need for Interpreter:  None   Date of Referral:  07/03/16     Reason for Referral:  Current Substance Use/Substance Use During Pregnancy    Referral Source:  Physician   Address:  [place], [place] [address]  Phone number:  [phone]   Household Members:  Self, Minor Children, Spouse Kelly Gordon (04/18/11), Kelly Gordon (10/03/2007), Kelly Gordon (09/30/1987) )   Natural Supports (not living in the home):  Friends, Extended Family, Immediate Family   Professional Supports: None   Employment: Unemployed   Type of Work: Unemployed    Education:  9 to 11 years   Financial Resources:  Medicaid   Other Resources:  Physicist, medical    Cultural/Religious Considerations Which May Impact Care:  None reported at this time.   Strengths:  Ability to meet basic needs , Compliance with medical plan , Home prepared for child    Risk Factors/Current Problems:  Substance Use    Cognitive State:  Alert , Able to Concentrate , Insightful    Mood/Affect:  Comfortable , Interested , Calm    CSW Assessment: CSW met with MOB at bedside to complete assessment. At this time, MOB was sitting in bed bottle feeding baby while FOB was asleep on couch and older two children were sitting in rocking chair watching Tv. This Probation officer explained role and reasoning for visit being due to MOB's hx of THC use during pregnancy. This Probation officer informed MOB of the hospitals policy and procedure regarding substance and mandatory reporting for positive drug screens. This Probation officer informed MOB that a  cord blood test and a UDS were taken of baby. UDS results were (-) negative; however, cord blood test results are still pending. MOB verbalized understanding and hand no questions and/or concerns regarding the matter.   This Probation officer assessed for additional psychosocial needs. At this time, MOB reports feeling prepared for she, baby and family to d.c home. She notes she has everything she needs as far as basic necessity's for baby. She reports having good support from her husband and others. MOB declines and need for further support/resources. This writer congratualted MOB and noted should and other needs arise, she can inform her RN who will then consult me to come back down.   CSW Plan/Description:  Other (Comment) (CSW will continue to follow pending cord blood test results. )   Ferdinand Lango Rosalie Gelpi, MSW, [facility]  Office: [phone]

## 2016-07-04 NOTE — Plan of Care (Signed)
Problem: Nutritional: Goal: Mothers verbalization of comfort with breastfeeding process will improve Outcome: Not Met (add Reason) Bottle Fed

## 2016-07-04 NOTE — Progress Notes (Signed)
Subjective: Postpartum Day #1: Cesarean Delivery/BTL Patient reports tolerating PO; foley just removed; bottlefeeding; no dizziness w/ ambulation  Objective: Vital signs in last 24 hours: Temp:  [97 F (36.1 C)-98.5 F (36.9 C)] 98.3 F (36.8 C) (12/16 0630) Pulse Rate:  [42-70] 50 (12/16 0630) Resp:  [9-28] 18 (12/16 0630) BP: (94-108)/(49-69) 99/52 (12/16 0630) SpO2:  [97 %-100 %] 98 % (12/16 0230)  Physical Exam:  General: alert and cooperative Lochia: appropriate Uterine Fundus: firm Incision: honeycomb dry and intact DVT Evaluation: No evidence of DVT seen on physical exam.   Recent Labs  07/03/16 1130 07/04/16 0529  HGB 9.6* 8.4*  HCT 29.4* 25.4*    Assessment/Plan: Status post Cesarean section. Doing well postoperatively.  Continue current care. Begin FeSO4 bid.  Cam HaiSHAW, Aalivia Mcgraw CNM 07/04/2016, 7:50 AM

## 2016-07-04 NOTE — Plan of Care (Signed)
Problem: Urinary Elimination: Goal: Ability to reestablish a normal urinary elimination pattern will improve Outcome: Completed/Met Date Met: 07/04/16 Pt voiding independently and without difficulty

## 2016-07-05 MED ORDER — BISACODYL 10 MG RE SUPP
10.0000 mg | Freq: Every day | RECTAL | Status: DC | PRN
  Administered 2016-07-05: 10 mg via RECTAL
  Filled 2016-07-05: qty 1

## 2016-07-05 MED ORDER — KETOROLAC TROMETHAMINE 10 MG PO TABS: 10.0000 mg | ORAL_TABLET | Freq: Four times a day (QID) | ORAL | Status: DC

## 2016-07-05 MED ORDER — KETOROLAC TROMETHAMINE 30 MG/ML IJ SOLN
30.0000 mg | Freq: Once | INTRAMUSCULAR | Status: AC
  Administered 2016-07-05: 30 mg via INTRAMUSCULAR
  Filled 2016-07-05: qty 1

## 2016-07-05 MED ORDER — KETOROLAC TROMETHAMINE 10 MG PO TABS
10.0000 mg | ORAL_TABLET | Freq: Four times a day (QID) | ORAL | Status: DC
  Filled 2016-07-05 (×3): qty 1

## 2016-07-05 NOTE — Progress Notes (Signed)
At 1740 contacted Dr Omer JackMumaw to clarify order, instructed to give Toradol IM for the 1800 dose and following toradol doses would be PO

## 2016-07-05 NOTE — Progress Notes (Signed)
Patient ID: Kelly Gordon, female   DOB: 03/25/1991, 25 y.o.   MRN: [id number]  S: Called to patient's room by RN due to pain medication is not working for her. As I entered the room, the patient is up walking around in the room, tending to her other children, FOB is holding the baby. Patient states her pain is about 8/10, with 10mg  Oxycodone, her pain only goes down to about a 7. Pain is near incision site, on uterus. Cannot get into a comfortable position, pain is constant. VB is minimal. She is passing gas and passed only a slight hard BM.    O:  Vitals:   07/04/16 1728 07/05/16 0624  BP: 95/63 107/68  Pulse: (!) 51 (!) 57  Resp: 16 20  Temp: 98.1 F (36.7 C) 98 F (36.7 C)    CV: RRR, no murmurs Resp: CTAB Abd: soft, distended but not taught abdomen, tender only around incision site and uterus. No erythema. Incision is clean, dry, intact with honeycomb in place. Good bowel sounds. Uterus is firm below umbilicus. Extr: trace edema bilateral LE, without pitting.   A/P: Will adjust NSAID from motrin to toradol. Abdominal binder Rectal suppository to help with constipation

## 2016-07-05 NOTE — Progress Notes (Addendum)
Communicated with Dr Omer JackMumaw concerning pain control. Per phone conversation, pt's motrin will be switched to toradol, pt will be given an abdominal guard and will receive a suppository for distension

## 2016-07-05 NOTE — Progress Notes (Signed)
POSTPARTUM PROGRESS NOTE  Post Operative Day 2 Subjective:  Kelly Gordon is a 25 y.o. [id number] 3832w0d s/p rltcs with BTL.  No acute events overnight.  Pt denies problems with ambulating, voiding or po intake.  She denies nausea or vomiting.  Pain is moderately controlled.  She has had flatus. She has not had bowel movement.  Lochia Small.   Objective: Blood pressure 107/68, pulse (!) 57, temperature 98 F (36.7 C), temperature source Oral, resp. rate 20, last menstrual period 10/04/2015, SpO2 98 %, unknown if currently breastfeeding.  Physical Exam:  General: alert, cooperative and no distress Lochia:normal flow Chest: no respiratory distress Heart:regular rate, distal pulses intact Incision: C/D/I honeycomb Abdomen: soft, nontender Uterine Fundus: firm, appropriately tender DVT Evaluation: No calf swelling or tenderness Extremities: trace edema   Recent Labs  07/03/16 1130 07/04/16 0529  HGB 9.6* 8.4*  HCT 29.4* 25.4*    Assessment/Plan:  ASSESSMENT: Kelly Gordon is a 25 y.o. [id number] 6332w0d s/p RLTCS with BTL  Plan for discharge tomorrow   LOS: 2 days   Les Pouicholas SchenkMD 07/05/2016, 7:30 AM

## 2016-07-06 ENCOUNTER — Encounter: Payer: Self-pay | Admitting: Obstetrics & Gynecology

## 2016-07-06 ENCOUNTER — Encounter: Payer: Self-pay | Admitting: Student

## 2016-07-06 MED ORDER — FERROUS FUMARATE 324 (106 FE) MG PO TABS: 1.0000 | ORAL_TABLET | Freq: Two times a day (BID) | ORAL | 3 refills | Status: DC

## 2016-07-06 MED ORDER — OXYCODONE HCL 10 MG PO TABS: 10.0000 mg | ORAL_TABLET | ORAL | 0 refills | Status: DC | PRN

## 2016-07-06 MED ORDER — KETOROLAC TROMETHAMINE 10 MG PO TABS: 10.0000 mg | ORAL_TABLET | Freq: Four times a day (QID) | ORAL | 0 refills | Status: DC

## 2016-07-06 NOTE — Lactation Note (Signed)
This note was copied from a baby's chart. Lactation Consultation Note  Patient Name: Kelly Gordon ZOXWR'UToday's Date: 07/06/2016 Reason for consult: Follow-up assessment  With this mom who breast fed baby once since birth, but is exclusively formula feeding. Breast care reviewed with mom, and shw knows to call for questions/conerns   Maternal Data Formula Feeding for Exclusion: Yes Reason for exclusion: Mother's choice to formula feed on admision  Feeding Feeding Type: Bottle Fed - Formula Nipple Type: Slow - flow  LATCH Score/Interventions                      Lactation Tools Discussed/Used     Consult Status Consult Status: Complete Follow-up type: Call as needed    Alfred LevinsLee, Leeann Bady Anne 07/06/2016, 9:06 AM

## 2016-07-06 NOTE — Discharge Summary (Signed)
OB Discharge Summary     Patient Name: Kelly BeachShante M Art DOB: 06/09/1991 MRN: [id number]  Date of admission: 07/03/2016 Delivering MD: [place] BingPICKENS, CHARLIE   Date of discharge: 07/06/2016  Admitting diagnosis: cpt [id number] - REPEAT c-section Intrauterine pregnancy: 3853w0d     Secondary diagnosis:  Active Problems:   History of cesarean delivery, currently pregnant  Additional problems[facility]     Discharge diagnosis: Term Pregnancy Delivered and rLTCS & BTL                                                                                                Post partum procedures:none   Complications: None  Hospital course:  Sceduled C/S   25 y.o. yo [id number] at 753w0d was admitted to the hospital 07/03/2016 for scheduled cesarean section with the following indication:Elective Repeat.  Membrane Rupture Time/Date: 1:11 PM ,07/03/2016   Patient delivered a Viable infant.07/03/2016  Details of operation can be found in separate operative note.  Pateint had an uncomplicated postpartum course. SW eval for hx drug use and she will follow baby's tox screen.  She is ambulating, tolerating a regular diet, passing flatus, and urinating well. Patient is discharged home in stable condition on  07/06/16          Physical exam Vitals:   07/04/16 1728 07/05/16 0624 07/05/16 1800 07/06/16 0642  BP: 95/63 107/68 (!) 102/53 100/60  Pulse: (!) 51 (!) 57 66 60  Resp: 16 20 20 18   Temp: 98.1 F (36.7 C) 98 F (36.7 C) 98.4 F (36.9 C) 97.6 F (36.4 C)  TempSrc: Oral Oral Oral   SpO2:  98% 100%    General: alert, cooperative and no distress Lochia: appropriate Uterine Fundus: firm Incision: Healing well with no significant drainage DVT Evaluation: No evidence of DVT seen on physical exam. Labs: Lab Results  Component Value Date   WBC 9.5 07/04/2016   HGB 8.4 (L) 07/04/2016   HCT 25.4 (L) 07/04/2016   MCV 82.7 07/04/2016   PLT 137 (L) 07/04/2016   CMP Latest Ref Rng &  Units 11/13/2015  Glucose 65 - 99 mg/dL 81  BUN 6 - 20 mg/dL <9(F<5(L)  Creatinine 6.210.44 - 1.00 mg/dL 3.080.76  Sodium 657135 - 846145 mmol/L 136  Potassium 3.5 - 5.1 mmol/L 3.9  Chloride 101 - 111 mmol/L 103  CO2 22 - 32 mmol/L 26  Calcium 8.9 - 10.3 mg/dL 9.7  Total Protein 6.5 - 8.1 g/dL -  Total Bilirubin 0.3 - 1.2 mg/dL -  Alkaline Phos 38 - 962126 U/L -  AST 15 - 41 U/L -  ALT 14 - 54 U/L -    Discharge instruction: per After Visit Summary and "Baby and Me Booklet".  After visit meds:  Allergies as of 07/06/2016   No Known Allergies     Medication List    STOP taking these medications   valACYclovir 500 MG tablet Commonly known as:  VALTREX     TAKE these medications   Ferrous Fumarate 324 (106 Fe) MG Tabs tablet Commonly known as:  HEMOCYTE -  106 mg FE Take 1 tablet (106 mg of iron total) by mouth 2 (two) times daily.   ketorolac 10 MG tablet Commonly known as:  TORADOL Take 1 tablet (10 mg total) by mouth every 6 (six) hours.   Oxycodone HCl 10 MG Tabs Take 1 tablet (10 mg total) by mouth every 4 (four) hours as needed (pain scale > 7).   prenatal multivitamin Tabs tablet Take 1 tablet by mouth daily at 12 noon.       Diet: routine diet  Activity: Advance as tolerated. Pelvic rest for 6 weeks.   Outpatient follow up:6 weeks Follow up Appt:Future Appointments Date Time Provider Department Center  07/06/2016 12:40 PM Adam PhenixJames G Arnold, MD [facility]  07/14/2016 12:40 PM Adam PhenixJames G Arnold, MD [facility]   Follow up Visit:No Follow-up on file.  Postpartum contraception: Tubal Ligation  Newborn Data: Live born female  Birth Weight: 5 lb 14.9 oz (2690 g) APGAR: 8, 9  Baby Feeding: Bottle Disposition:home with mother   07/06/2016 Clearance CootsAndrew Tyson, MD   CNM attestation I have seen and examined this patient and agree with above documentation in the resident's note.   Kelly Gordon is a 25 y.o. [id number] s/p rLTCS and BTL.   Pain is well controlled.  Plan for birth  control is bilateral tubal ligation.  Method of Feeding: bottle  PE:  BP 100/60 (BP Location: Left Arm)   Pulse 60   Temp 97.6 F (36.4 C)   Resp 18   LMP 10/04/2015 (Exact Date)   SpO2 100%   Breastfeeding? Unknown  Fundus firm   Recent Labs  07/03/16 1130 07/04/16 0529  HGB 9.6* 8.4*  HCT 29.4* 25.4*     Plan: discharge today - postpartum care discussed - f/u clinic in 6 weeks for postpartum visit   Cam HaiSHAW, KIMBERLY, CNM 8:50 AM  07/06/2016

## 2016-07-06 NOTE — Discharge Instructions (Signed)
Postpartum Care After Cesarean Delivery  The period of time right after you deliver your newborn is called the postpartum period.  What kind of medical care will I receive?  · You may continue to receive fluids and medicines through an IV tube inserted into one of your veins.  · You may have small, flexible tube (catheter) draining urine from your bladder into a bag outside of your body. The catheter will be removed as soon as possible.  · You may be given a squirt bottle to use when you go to the bathroom. You may use this until you are comfortable wiping as usual. To use the squirt bottle, follow these steps:  ? Before you urinate, fill the squirt bottle with warm water. The water should be warm. Do not use hot water.  ? After you urinate, while you are sitting on the toilet, use the squirt bottle to rinse the area around your urethra and vaginal opening. This rinses away any urine and blood.  ? You may do this instead of wiping. As you start healing, you may use the squirt bottle before wiping yourself. Make sure to wipe gently.  ? Fill the squirt bottle with clean water every time you use the bathroom.  · You will be given sanitary pads to wear.  · Your incision will be monitored to make sure it is healing properly. You will be told when it is safe for your stitches, staples, or skin adhesive tape to be removed.  What can I expect?  · You may not feel the need to urinate for several hours after delivery.  · You will have some soreness and pain in your abdomen. You may have a small amount of blood or clear fluid coming from your incision.  · If you are breastfeeding, you may have uterine contractions every time you breastfeed for up to several weeks postpartum. Uterine contractions help your uterus return to its normal size.  · It is normal to have vaginal bleeding (lochia) after delivery. The amount and appearance of lochia is often similar to a menstrual period in the first week after delivery. It will  gradually decrease over the next few weeks to a dry, yellow-brown discharge. For most women, lochia stops completely by 6-8 weeks after delivery. Vaginal bleeding can vary from woman to woman.  · Within the first few days after delivery, you may have breast engorgement. This is when your breasts feel heavy, full, and uncomfortable. Your breasts may also throb and feel hard, tightly stretched, warm, and tender. After this occurs, you may have milk leaking from your breasts. Your health care provider can help you relieve discomfort due to breast engorgement. Breast engorgement should go away within a few days.  · You may feel more sad or worried than normal due to hormonal changes after delivery. These feelings should not last more than a few days. If these feelings do not go away after several days, speak with your health care provider.  How should I care for myself?  · Tell your health care provider if you have pain or discomfort.  · Drink enough water to keep your urine clear or pale yellow.  · Wash your hands thoroughly with soap and water for at least 20 seconds after changing your sanitary pads or using the toilet, and before holding or feeding your baby.  · If you are not breastfeeding, avoid touching your breasts a lot. Doing this can make your breasts produce more milk.  · If   you become weak or lightheaded, or you feel like you might faint, ask for help before:  ? Getting out of bed.  ? Showering.  · Change your sanitary pads frequently. Watch for any changes in your flow, such as a sudden increase in volume, a change in color, or the passing of large blood clots. If you pass a blood clot from your vagina, save it to show to your health care provider. Do not flush blood clots down the toilet without having your health care provider look at them.  · Make sure that all your vaccinations are up to date. This can help protect you and your baby from getting certain diseases. You may need to have immunizations done  before you leave the hospital.  · If desired, talk with your health care provider about methods of family planning or birth control (contraception).  How can I start bonding with my baby?  Spending as much time as possible with your baby is very important. During this time, you and your baby can get to know each other and develop a bond. Having your baby stay with you in your room (rooming in) can give you time to get to know your baby. Rooming in can also help you become comfortable caring for your baby. Breastfeeding can also help you bond with your baby.  How can I plan for returning home with my baby?  · Make sure that you have a car seat installed in your vehicle.  ? Your car seat should be checked by a certified car seat installer to make sure that it is installed safely.  ? Make sure that your baby fits into the car seat safely.  · Ask your health care provider any questions you have about caring for yourself or your baby. Make sure that you are able to contact your health care provider with any questions after leaving the hospital.  This information is not intended to replace advice given to you by your health care provider. Make sure you discuss any questions you have with your health care provider.  Document Released: 03/30/2012 Document Revised: 12/09/2015 Document Reviewed: 06/10/2015  Elsevier Interactive Patient Education © 2017 [employer] Inc[employer]

## 2016-07-14 ENCOUNTER — Encounter: Payer: Self-pay | Admitting: Obstetrics & Gynecology

## 2016-08-03 ENCOUNTER — Ambulatory Visit: Payer: Self-pay | Admitting: Student

## 2016-08-03 ENCOUNTER — Encounter: Payer: Self-pay | Admitting: *Deleted

## 2016-08-03 NOTE — Progress Notes (Signed)
Nargis missed her scheduled postpartum appointment. No need to call patient per provider , may reschedule if she calls.

## 2016-08-10 ENCOUNTER — Encounter (HOSPITAL_COMMUNITY): Payer: Self-pay

## 2016-08-12 ENCOUNTER — Ambulatory Visit: Payer: Self-pay | Admitting: Advanced Practice Midwife

## 2017-02-16 IMAGING — US US MFM OB TRANSVAGINAL
1 series · 15 of 28 positions shown · non-contrast
Comparison: none

[Series 1: us mfm ob transvaginal · 42 acquisitions, 15 frames shown]
[im 1/42]
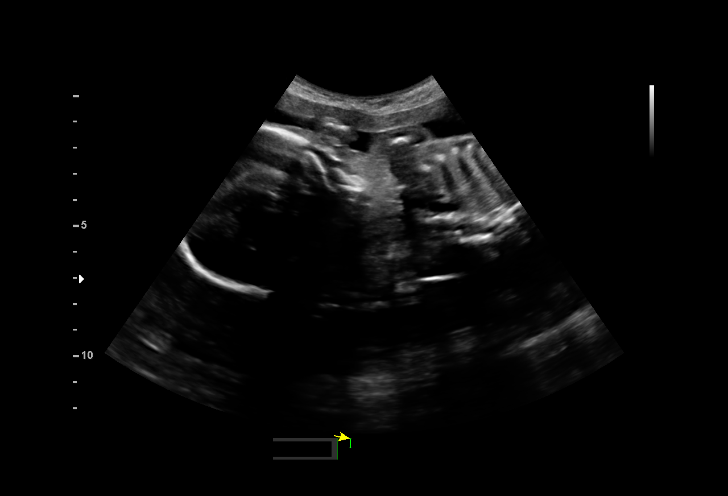
[im 4/42]
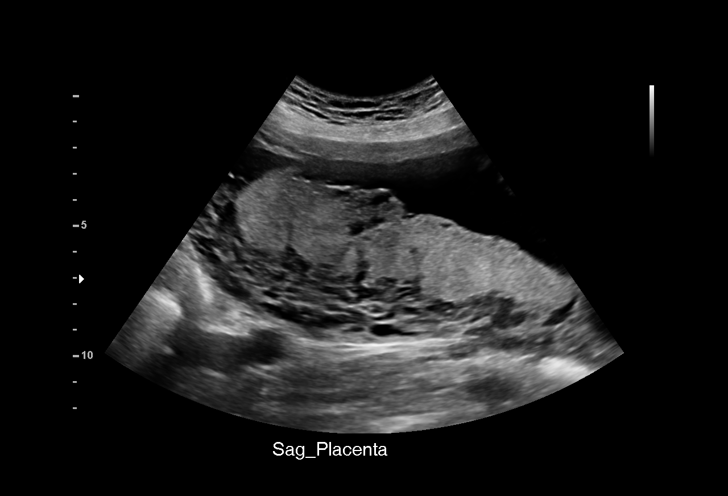
[im 7/42]
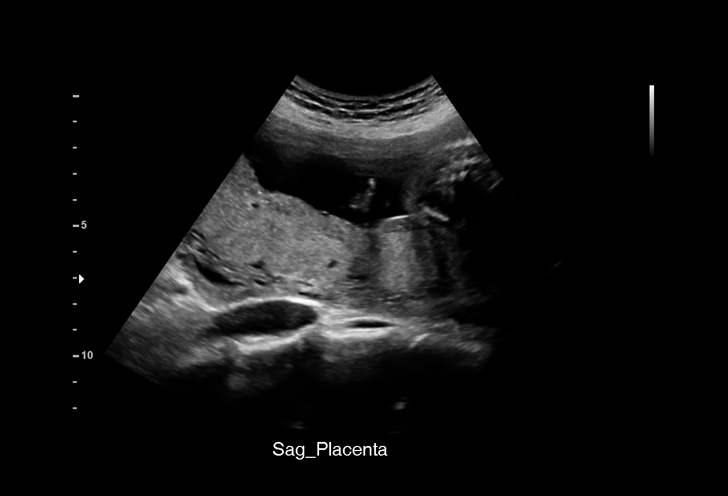
[im 10/42]
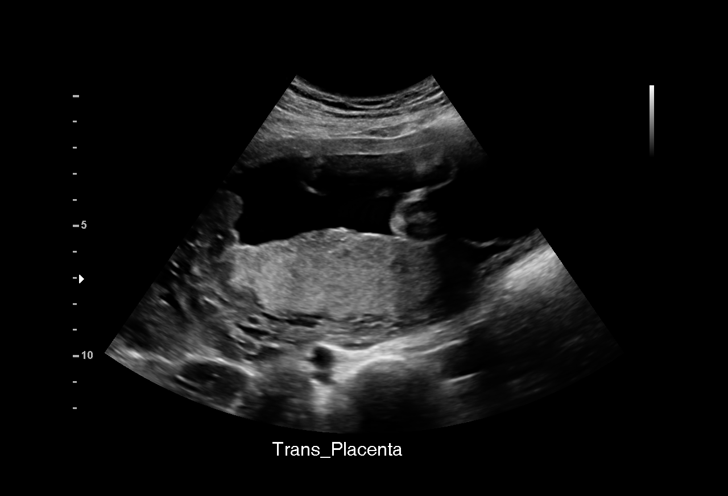
[im 13/42]
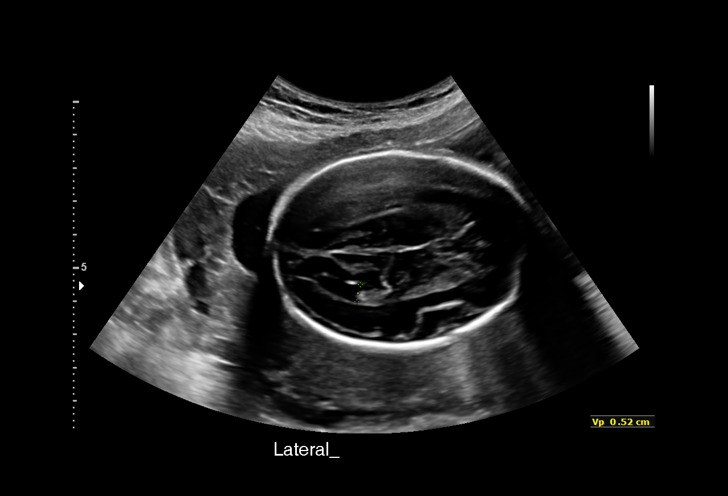
[im 16/42]
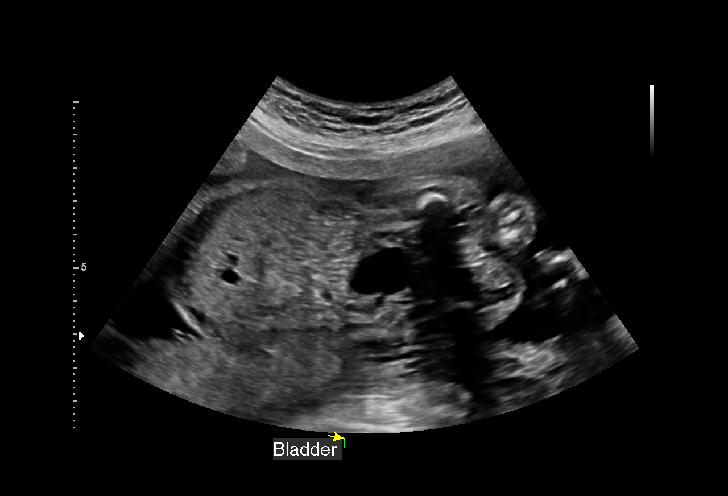
[im 19/42]
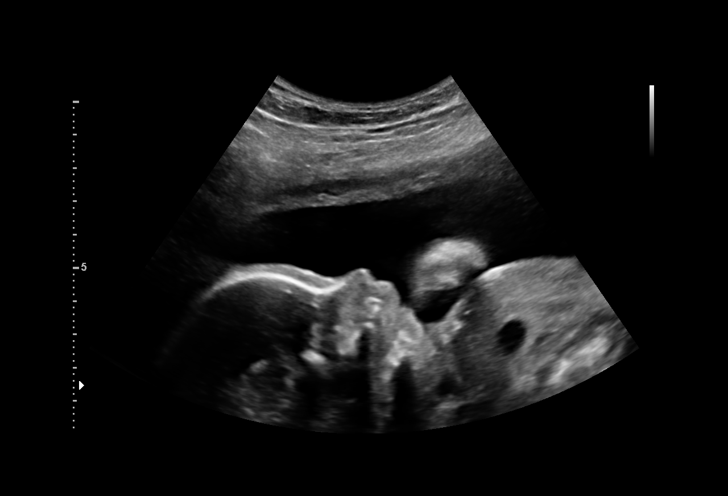
[im 22/42]
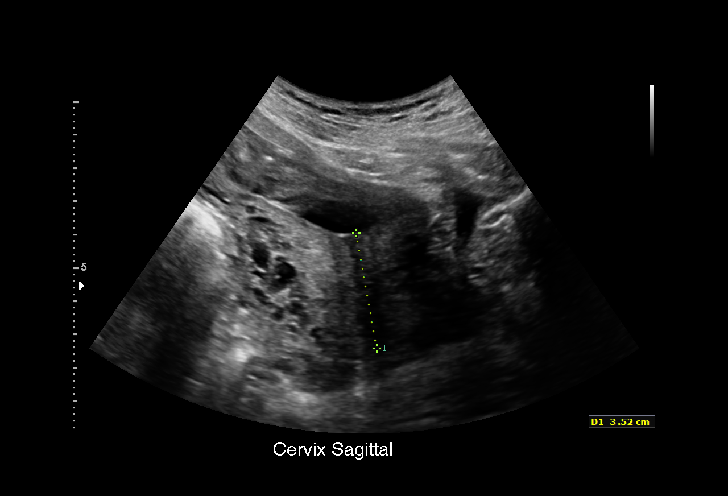
[im 23/42]
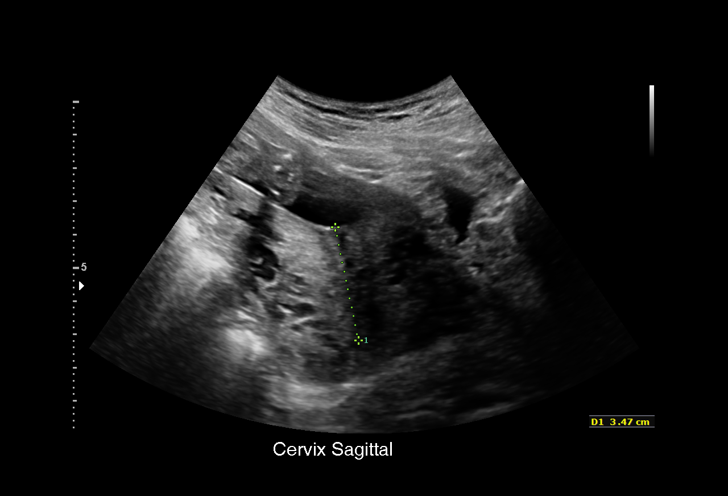
[im 26/42]
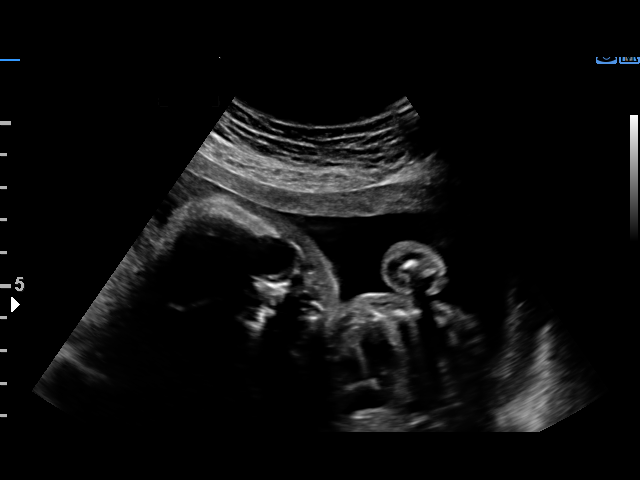
[im 29/42]
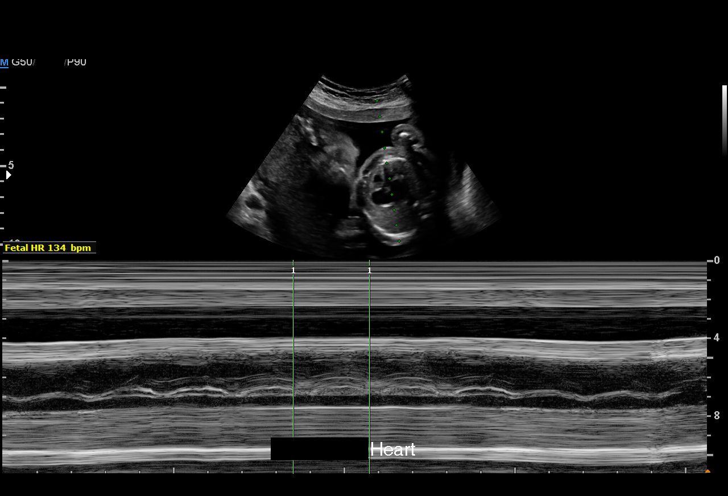
[im 32/42]
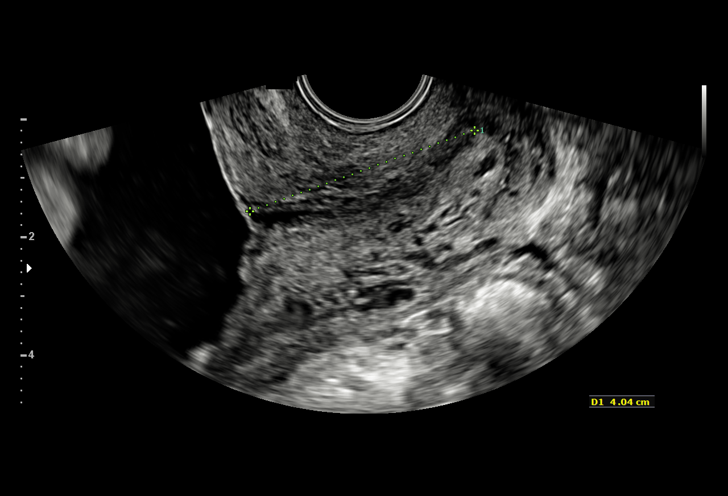
[im 35/42]
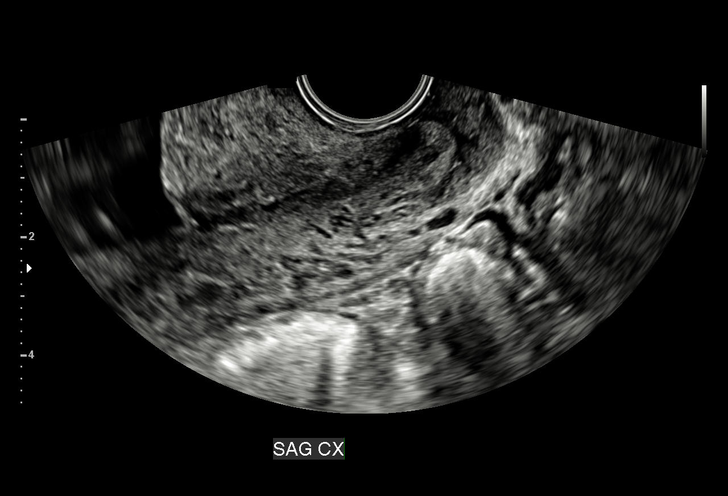
[im 38/42]
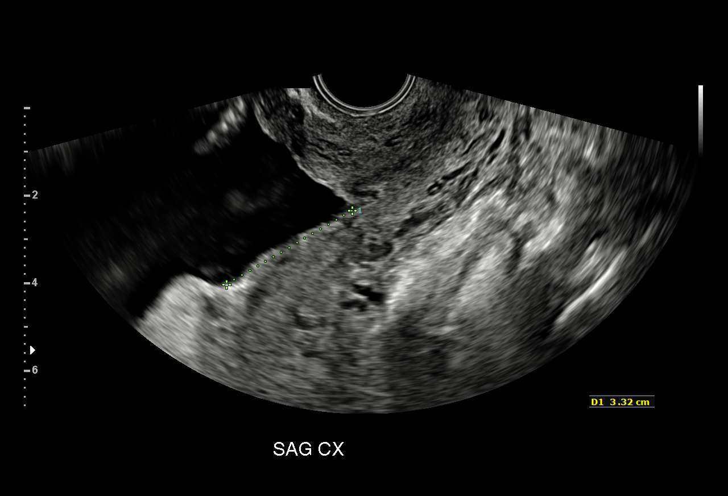
[im 42/42]
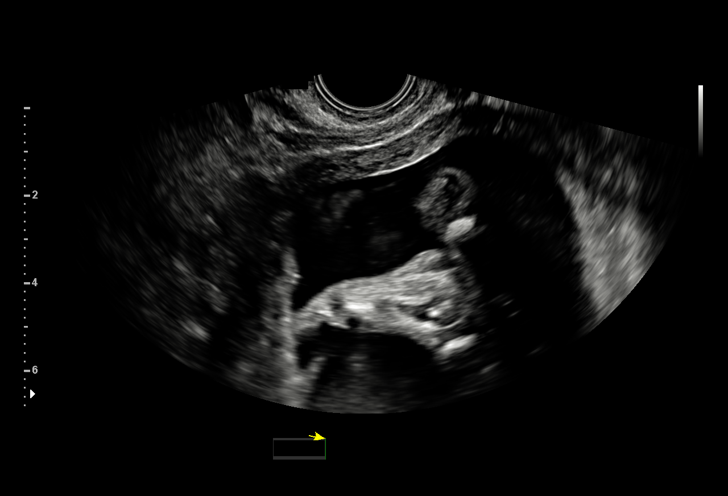

[15 of 28 positions shown; findings below may reference images not displayed]

OB/Gyn Clinic

1  MAXIMILIANO DANIEL LECLERCQ            [id number]      [id number]     [id number]
Indications

24 weeks gestation of pregnancy
Poor obstetric history: Previous preterm
delivery, antepartum (34 weeks)
Previous cesarean delivery, antepartum
Tobacco use complicating pregnancy,
second trimester
OB History

Gravidity:    5         Term:   1        Prem:   1        SAB:   2
TOP:          0       Ectopic:  0        Living: 2
Fetal Evaluation

Num Of Fetuses:     1
Fetal Heart         134
Rate(bpm):
Cardiac Activity:   Observed
Presentation:       Breech
Placenta:           Posterior, above cervical os
P. Cord Insertion:  Previously Visualized

Amniotic Fluid
AFI FV:      Subjectively within normal limits

Largest Pocket(cm)
3.93
Gestational Age

LMP:           24w 5d        Date:  10/04/15                 EDD:   07/10/16
Best:          24w 5d     Det. By:  LMP  (10/04/15)          EDD:   07/10/16
Cervix Uterus Adnexa

Cervix
Length:            4.1  cm.
Measured transvaginally.
Impression

SIUP at 24+5 weeks, hx PTB, prior tobacco use (patient
reports that she quit smoking)
active singleton fetus
Normal amniotic fluid volume
EV views of cervix: normal length without funneling
Recommendations

Follow up as clinically indicated.
Continue Jim until 36 weeks.

## 2017-11-30 MED FILL — oxyCODONE HCL 20 MG TABS: 20 | 30 days supply | Qty: 120 | Fill #0

## 2017-12-30 MED FILL — oxyCODONE HCL 20 MG TABS: 20 | 30 days supply | Qty: 120 | Fill #0

## 2017-12-30 MED FILL — IBUPROFEN 800 MG TAB: 800 | 30 days supply | Qty: 30 | Fill #0

## 2018-02-23 ENCOUNTER — Emergency Department (HOSPITAL_COMMUNITY): Payer: Medicaid Other

## 2018-02-23 ENCOUNTER — Other Ambulatory Visit: Payer: Self-pay

## 2018-02-23 ENCOUNTER — Observation Stay (HOSPITAL_COMMUNITY)
Admission: EM | Admit: 2018-02-23 | Discharge: 2018-02-24 | Disposition: A | Discharge status: Home / Self Care | Payer: Medicaid Other | Attending: Internal Medicine | Admitting: Internal Medicine

## 2018-02-23 ENCOUNTER — Encounter (HOSPITAL_COMMUNITY): Payer: Self-pay

## 2018-02-23 DIAGNOSIS — R112 Nausea with vomiting, unspecified: Secondary | ICD-10-CM | POA: Insufficient documentation

## 2018-02-23 DIAGNOSIS — R0902 Hypoxemia: Secondary | ICD-10-CM | POA: Diagnosis present

## 2018-02-23 DIAGNOSIS — Z87891 Personal history of nicotine dependence: Secondary | ICD-10-CM | POA: Diagnosis not present

## 2018-02-23 DIAGNOSIS — Y929 Unspecified place or not applicable: Secondary | ICD-10-CM | POA: Diagnosis not present

## 2018-02-23 DIAGNOSIS — T405X1A Poisoning by cocaine, accidental (unintentional), initial encounter: Secondary | ICD-10-CM | POA: Diagnosis not present

## 2018-02-23 DIAGNOSIS — N179 Acute kidney failure, unspecified: Secondary | ICD-10-CM | POA: Diagnosis present

## 2018-02-23 DIAGNOSIS — D72829 Elevated white blood cell count, unspecified: Secondary | ICD-10-CM | POA: Insufficient documentation

## 2018-02-23 DIAGNOSIS — T401X1A Poisoning by heroin, accidental (unintentional), initial encounter: Principal | ICD-10-CM | POA: Insufficient documentation

## 2018-02-23 DIAGNOSIS — T50901A Poisoning by unspecified drugs, medicaments and biological substances, accidental (unintentional), initial encounter: Secondary | ICD-10-CM | POA: Diagnosis present

## 2018-02-23 DIAGNOSIS — T50904A Poisoning by unspecified drugs, medicaments and biological substances, undetermined, initial encounter: Secondary | ICD-10-CM | POA: Diagnosis not present

## 2018-02-23 LAB — CBC
HEMATOCRIT: 34 % — AB (ref 36.0–46.0)
HEMOGLOBIN: 10.7 g/dL — AB (ref 12.0–15.0)
MCH: 27.2 pg (ref 26.0–34.0)
MCHC: 31.5 g/dL (ref 30.0–36.0)
MCV: 86.5 fL (ref 78.0–100.0)
Platelets: 208 10*3/uL (ref 150–400)
RBC: 3.93 MIL/uL (ref 3.87–5.11)
RDW: 14 % (ref 11.5–15.5)
WBC: 9.7 10*3/uL (ref 4.0–10.5)

## 2018-02-23 LAB — RAPID URINE DRUG SCREEN, HOSP PERFORMED
Amphetamines: NOT DETECTED
BENZODIAZEPINES: NOT DETECTED
Barbiturates: NOT DETECTED
COCAINE: POSITIVE — AB
Opiates: POSITIVE — AB
Tetrahydrocannabinol: NOT DETECTED

## 2018-02-23 LAB — CBC WITH DIFFERENTIAL/PLATELET
ABS IMMATURE GRANULOCYTES: 0.1 10*3/uL (ref 0.0–0.1)
BASOS ABS: 0.1 10*3/uL (ref 0.0–0.1)
Basophils Relative: 0 %
EOS PCT: 0 %
Eosinophils Absolute: 0 10*3/uL (ref 0.0–0.7)
HEMATOCRIT: 37.2 % (ref 36.0–46.0)
HEMOGLOBIN: 11.5 g/dL — AB (ref 12.0–15.0)
Immature Granulocytes: 1 %
LYMPHS ABS: 1 10*3/uL (ref 0.7–4.0)
LYMPHS PCT: 7 %
MCH: 27.3 pg (ref 26.0–34.0)
MCHC: 30.9 g/dL (ref 30.0–36.0)
MCV: 88.4 fL (ref 78.0–100.0)
Monocytes Absolute: 1.3 10*3/uL — ABNORMAL HIGH (ref 0.1–1.0)
Monocytes Relative: 8 %
NEUTROS ABS: 12.8 10*3/uL — AB (ref 1.7–7.7)
Neutrophils Relative %: 84 %
Platelets: 214 10*3/uL (ref 150–400)
RBC: 4.21 MIL/uL (ref 3.87–5.11)
RDW: 14 % (ref 11.5–15.5)
WBC: 15.2 10*3/uL — AB (ref 4.0–10.5)

## 2018-02-23 LAB — BASIC METABOLIC PANEL
ANION GAP: 14 (ref 5–15)
BUN: 7 mg/dL (ref 6–20)
CHLORIDE: 97 mmol/L — AB (ref 98–111)
CO2: 25 mmol/L (ref 22–32)
Calcium: 9 mg/dL (ref 8.9–10.3)
Creatinine, Ser: 1.35 mg/dL — ABNORMAL HIGH (ref 0.44–1.00)
GFR calc Af Amer: >60 mL/min (ref >60)
GFR calc non Af Amer: 52 mL/min — ABNORMAL LOW (ref >60)
GLUCOSE: 187 mg/dL — AB (ref 70–99)
POTASSIUM: 3.5 mmol/L (ref 3.5–5.1)
SODIUM: 136 mmol/L (ref 135–145)

## 2018-02-23 LAB — CREATININE, SERUM
Creatinine, Ser: 0.97 mg/dL (ref 0.44–1.00)
GFR calc non Af Amer: >60 mL/min (ref >60)

## 2018-02-23 LAB — I-STAT BETA HCG BLOOD, ED (MC, WL, AP ONLY)

## 2018-02-23 MED ORDER — SODIUM CHLORIDE 0.9 % IV BOLUS
1000.0000 mL | Freq: Once | INTRAVENOUS | Status: AC
  Administered 2018-02-23: 1000 mL via INTRAVENOUS

## 2018-02-23 MED ORDER — ACETAMINOPHEN 325 MG PO TABS: 650.0000 mg | ORAL_TABLET | Freq: Four times a day (QID) | ORAL | Status: DC | PRN

## 2018-02-23 MED ORDER — ONDANSETRON HCL 4 MG PO TABS: 4.0000 mg | ORAL_TABLET | Freq: Four times a day (QID) | ORAL | Status: DC | PRN

## 2018-02-23 MED ORDER — SODIUM CHLORIDE 0.9% FLUSH: 3.0000 mL | INTRAVENOUS | Status: DC | PRN

## 2018-02-23 MED ORDER — SODIUM CHLORIDE 0.9 % IV SOLN: 250.0000 mL | INTRAVENOUS | Status: DC | PRN

## 2018-02-23 MED ORDER — IPRATROPIUM-ALBUTEROL 0.5-2.5 (3) MG/3ML IN SOLN
3.0000 mL | Freq: Four times a day (QID) | RESPIRATORY_TRACT | Status: DC
  Administered 2018-02-23: 3 mL via RESPIRATORY_TRACT
  Filled 2018-02-23: qty 3

## 2018-02-23 MED ORDER — ACETAMINOPHEN 650 MG RE SUPP: 650.0000 mg | Freq: Four times a day (QID) | RECTAL | Status: DC | PRN

## 2018-02-23 MED ORDER — ONDANSETRON HCL 4 MG/2ML IJ SOLN: 4.0000 mg | Freq: Four times a day (QID) | INTRAMUSCULAR | Status: DC | PRN

## 2018-02-23 MED ORDER — GUAIFENESIN ER 600 MG PO TB12
600.0000 mg | ORAL_TABLET | Freq: Two times a day (BID) | ORAL | Status: DC
  Administered 2018-02-23 – 2018-02-24 (×3): 600 mg via ORAL
  Filled 2018-02-23 (×3): qty 1

## 2018-02-23 MED ORDER — SODIUM CHLORIDE 0.9 % IV SOLN
3.0000 g | Freq: Once | INTRAVENOUS | Status: AC
  Administered 2018-02-23: 3 g via INTRAVENOUS
  Filled 2018-02-23: qty 3

## 2018-02-23 MED ORDER — POLYETHYLENE GLYCOL 3350 17 G PO PACK: 17.0000 g | PACK | Freq: Every day | ORAL | Status: DC | PRN

## 2018-02-23 MED ORDER — ALBUTEROL SULFATE (2.5 MG/3ML) 0.083% IN NEBU: 2.5000 mg | INHALATION_SOLUTION | RESPIRATORY_TRACT | Status: DC | PRN

## 2018-02-23 MED ORDER — ONDANSETRON HCL 4 MG/2ML IJ SOLN
4.0000 mg | Freq: Once | INTRAMUSCULAR | Status: AC
  Administered 2018-02-23: 4 mg via INTRAVENOUS
  Filled 2018-02-23: qty 2

## 2018-02-23 MED ORDER — HEPARIN SODIUM (PORCINE) 5000 UNIT/ML IJ SOLN
5000.0000 [IU] | Freq: Three times a day (TID) | INTRAMUSCULAR | Status: DC
  Filled 2018-02-23: qty 1

## 2018-02-23 MED ORDER — SODIUM CHLORIDE 0.9% FLUSH
3.0000 mL | Freq: Two times a day (BID) | INTRAVENOUS | Status: DC
  Administered 2018-02-23 – 2018-02-24 (×3): 3 mL via INTRAVENOUS

## 2018-02-23 MED ORDER — TRAZODONE HCL 50 MG PO TABS: 50.0000 mg | ORAL_TABLET | Freq: Every evening | ORAL | Status: DC | PRN

## 2018-02-23 NOTE — ED Notes (Signed)
Pt sating at 86% on RA; O2 2L reapplied; Pt sating at 100%

## 2018-02-23 NOTE — Care Management (Signed)
This is a no charge note  Pending admission per Dr. Gean BirchwoodPA, Joselyn Glassmanyler  27 year old lady with history of drug abuse, who presents with heroin overdose.  Patient is sluggish, but oriented x3, arousable.  Patient was given 2 mg of Narcan by EMS.  Patient has nausea and vomited in the ED.  Oxygen desaturation to 86% on room air, concerning for aspiration.  Patient was started with Unasyn by ED physician.  Patient is placed on telemetry bed of observation.   Lorretta HarpXilin Caelen Higinbotham, MD  [facility] Pager [phone]  If 7PM-7AM, please contact night-coverage [email or web address] Password [email or web address] 02/23/2018, 5:53 AM \

## 2018-02-23 NOTE — ED Notes (Signed)
Called lab, made aware of need for cultures

## 2018-02-23 NOTE — Progress Notes (Signed)
Pt admitted to [address] room 6. A&O. Skin intact. Call Bell within reach. All questions/concerns addressed. Will continue to monitor.

## 2018-02-23 NOTE — ED Triage Notes (Signed)
Patient brought in by EMS for drug overdose; per patient's family they told EMS patient had taken heroin and molly. Patient appears dazed but is able to confirm where she is and year. Patient not forthcoming with information. Given 2mg  Narcan intranasal by fire dept on scene.

## 2018-02-23 NOTE — ED Provider Notes (Signed)
[facility] EMERGENCY DEPARTMENT Provider Note   CSN: [id number] Arrival date & time: 02/23/18  0217     History   Chief Complaint Chief Complaint  Patient presents with  . Drug Overdose    HPI Kelly Gordon is a 27 y.o. female.  HPI 27 year old African-American female with no pertinent past medical history presents to the ED for evaluation of heroin overdose.  Per EMS patient family called them concerning overdose on heroin and mildly.'s patient appeared dazed in route by EMS.  She was given 2 mg of intranasal Narcan by fire department.  This had minimal improvement in her mental status.  Patient had episode of vomiting in the ED.  Reports history of heroin use.  No prior history of overdose.  Patient denies SI or HI.  Denies any chest pain or shortness of breath.  Level 5 caveat due to altered mental status. Past Medical History:  Diagnosis Date  . History of cocaine use    12/2015 NOB [facility] UDS  . Vaginal delivery 2009    Patient Active Problem List   Diagnosis Date Noted  . History of cesarean delivery, currently pregnant 07/03/2016  . History of cocaine use   . History of cesarean delivery, antepartum 04/15/2016  . Supervision of high risk pregnancy, antepartum 02/20/2016  . History of preterm delivery 02/20/2016  . H/O herpes genitalis 02/20/2016    Past Surgical History:  Procedure Laterality Date  . CESAREAN SECTION    . CESAREAN SECTION N/A 07/03/2016   Procedure: CESAREAN SECTION with BTL;  Surgeon: Teton Bing, MD;  [facility]: [facility];  Service: Obstetrics;  Laterality: N/A;     OB History    Gravida  5   Para  3   Term  2   Preterm  1   AB  2   Living  3     SAB  2   TAB      Ectopic      Multiple  0   Live Births  3            Home Medications    Prior to Admission medications   Medication Sig Start Date End Date Taking? Authorizing Provider  Ferrous Fumarate (HEMOCYTE - 106 MG FE) 324 (106 Fe)  MG TABS tablet Take 1 tablet (106 mg of iron total) by mouth 2 (two) times daily. 07/06/16   Arabella Merles, CNM  ketorolac (TORADOL) 10 MG tablet Take 1 tablet (10 mg total) by mouth every 6 (six) hours. 07/06/16   Arabella Merles, CNM  oxyCODONE 10 MG TABS Take 1 tablet (10 mg total) by mouth every 4 (four) hours as needed (pain scale > 7). 07/06/16   Arabella Merles, CNM  Prenatal Vit-Fe Fumarate-FA (PRENATAL MULTIVITAMIN) TABS tablet Take 1 tablet by mouth daily at 12 noon.    [provider]    Family History No family history on file.  Social History Social History   Tobacco Use  . Smoking status: Former Smoker    Packs/day: 0.10    Types: Cigarettes  . Smokeless tobacco: Never Used  Substance Use Topics  . Alcohol use: No    Comment: socially  . Drug use: No     Allergies   Patient has no known allergies.   Review of Systems Review of Systems  Unable to perform ROS: Mental status change     Physical Exam Updated Vital Signs BP 106/73 (BP Location: Right Arm)  Pulse (!) 102   Temp (!) 96 F (35.6 C) (Axillary)   Resp 15   Ht 5\' 4"  (1.626 m)   Wt 68 kg (150 lb)   LMP 02/16/2018 (Exact Date)   SpO2 (!) 83%   BMI 25.75 kg/m   Physical Exam  Constitutional: She is oriented to person, place, and time. She appears well-developed and well-nourished. No distress.  HENT:  Head: Normocephalic and atraumatic.  Eyes: Right eye exhibits no discharge. Left eye exhibits no discharge. No scleral icterus.  Pupils are equal round and reactive.  Conjunctive are injected.  Neck: Normal range of motion.  Cardiovascular: Normal rate, regular rhythm, normal heart sounds and intact distal pulses. Exam reveals no gallop and no friction rub.  No murmur heard. Pulmonary/Chest: Effort normal. No stridor. No respiratory distress. She has no wheezes. She has no rales. She exhibits no tenderness.  Decreased breath sounds.  Productive cough noted.  Musculoskeletal:  Normal range of motion.  Neurological: She is alert and oriented to person, place, and time.  Patient is sluggish.  Slow to follow commands.  Speech is slightly slurred.  Skin: No pallor.  Psychiatric: Her behavior is normal. Judgment and thought content normal.  Nursing note and vitals reviewed.    ED Treatments / Results  Labs (all labs ordered are listed, but only abnormal results are displayed) Labs Reviewed  RAPID URINE DRUG SCREEN, HOSP PERFORMED - Abnormal; Notable for the following components:      Result Value   Opiates POSITIVE (*)    Cocaine POSITIVE (*)    All other components within normal limits  BASIC METABOLIC PANEL - Abnormal; Notable for the following components:   Chloride 97 (*)    Glucose, Bld 187 (*)    Creatinine, Ser 1.35 (*)    GFR calc non Af Amer 52 (*)    All other components within normal limits  CBC WITH DIFFERENTIAL/PLATELET - Abnormal; Notable for the following components:   WBC 15.2 (*)    Hemoglobin 11.5 (*)    Neutro Abs 12.8 (*)    Monocytes Absolute 1.3 (*)    All other components within normal limits  I-STAT BETA HCG BLOOD, ED ([facility], [facility], [facility] ONLY)    EKG None  Radiology Dg Chest Portable 1 View  Result Date: 02/23/2018 CLINICAL DATA:  Drug overdose EXAM: PORTABLE CHEST 1 VIEW COMPARISON:  None. FINDINGS: The heart size and mediastinal contours are within normal limits. Both lungs are clear. The visualized skeletal structures are unremarkable. IMPRESSION: No active disease. Electronically Signed   By: Tollie Eth M.D.   On: 02/23/2018 03:05    Procedures Procedures (including critical care time)  Medications Ordered in ED Medications  sodium chloride 0.9 % bolus 1,000 mL (1,000 mLs Intravenous New Bag/Given 02/23/18 0507)  Ampicillin-Sulbactam (UNASYN) 3 g in sodium chloride 0.9 % 100 mL IVPB (has no administration in time range)  ondansetron (ZOFRAN) injection 4 mg (4 mg Intravenous Given 02/23/18 0250)  sodium chloride 0.9 % bolus 1,000  mL (0 mLs Intravenous Stopped 02/23/18 0351)     Initial Impression / Assessment and Plan / ED Course  I have reviewed the triage vital signs and the nursing notes.  Pertinent labs & imaging results that were available during my care of the patient were reviewed by me and considered in my medical decision making (see chart for details).     Patient presents to the ED for evaluation of overdose on heroin.  She was noted to be  hypoxic in the ED to 83%.  Placed on nonrebreather with improvement of saturations 92%.  Nursing staff does note the patient had an episode of vomiting prior to desaturation.  Patient has productive cough noted.  Decreased breath sounds but no focal rhonchorous sounds noted.Patient remains hemodynamic stable.  Patient remains hemodynamic stable this time.  Patient is alert and oriented x3 however she is slow to follow commands and sluggish.  However she is currently acting her airway.  Leukocytosis of 15,000 noted.  Mild elevation in creatinine to 1.35 otherwise no significant elect light derangement.  UDS positive for opiates and cocaine.  Respirations are 11.  Given the patient is alert oriented x3 and protecting her airway no indication for further Narcan at this time however patient was trialed off of oxygen she desatted to 87%.  Concern for possible aspiration pneumonia.  Patient placed back on 2 L of oxygen.  She was started on Unasyn and will be admitted for observation. Seen by ed attending who is agreeable with the above plan.  Spoke with Dr. Clyde LundborgNiu with hospital medicine who agrees to admission will see patient in the ED and place admission orders.  Final Clinical Impressions(s) / ED Diagnoses   Final diagnoses:  Accidental overdose of heroin, initial encounter [facility](HCC)  Hypoxia    ED Discharge Orders    None       Wallace KellerLeaphart, Anne-Marie Genson T, PA-C 02/23/18 0554    Shon BatonHorton, Courtney F, MD 02/23/18 [phone]

## 2018-02-23 NOTE — ED Notes (Signed)
Spoke with pt.  Pt states she is willing to stay until tomorrow am.

## 2018-02-23 NOTE — ED Notes (Signed)
Patient was given underwear and pads.

## 2018-02-23 NOTE — ED Notes (Signed)
Regular Diet was ordered for Lunch. 

## 2018-02-23 NOTE — H&P (Signed)
Patient Demographics:    Kelly Gordon, is a 27 y.o. female  MRN: [id number]   DOB - April 20, 1991  Admit Date - 02/23/2018  Outpatient Primary MD for the patient is Patient, No Pcp Per   Assessment & Plan:    Active Problems:   Drug overdose   AKI (acute kidney injury) (HCC)   1)AKI----acute kidney injury ,  creatinine on admission= 1.35 , baseline creatinine = 0.76   ,  Avoid nephrotoxic agents/dehydration/hypotension, hydrate  2)Heroin and Cocaine overdose----patient received intranasal Narcan from the fire department prior to arriving the she is waking up more, she wants to eat,   3)Emesis and possible aspiration----no hypoxia at this time, auscultatively lungs are mostly clear, patient received IV Unasyn in the ED, will hold off on further antibiotics for now.  Leukocytosis is probably reactive from cocaine and heroin overdose.  Blood cultures requested.  she has no fevers or productive cough and chest x-ray is clear  With History of - Reviewed by me  Past Medical History:  Diagnosis Date  . History of cocaine use    12/2015 NOB [facility] UDS  . Vaginal delivery 2009      Past Surgical History:  Procedure Laterality Date  . CESAREAN SECTION    . CESAREAN SECTION N/A 07/03/2016   Procedure: CESAREAN SECTION with BTL;  Surgeon: Pratt Bing, MD;  Location: [facility];  Service: Obstetrics;  Laterality: N/A;     Chief Complaint  Patient presents with  . Drug Overdose      HPI:    Kelly Gordon  is a 27 y.o. female  With h/o drug abuse, who presents with heroin and Cocaine overdose.  Patient was sluggish, but oriented x3, arousable.  patient received intranasal Narcan from the fire department prior to arriving the she is waking up more,.  Patient has nausea and vomited in the ED.    Patient was  started with Unasyn by ED  physician.  History is limited as patient is not very conversational, she is not really willing to talk, she just wants to be left alone, she is unhappy about lack of food  At the time of my evaluation patient is currently off oxygen, waking up a bit more, no fevers  In ED--- UDS is positive for cocaine and opiates, suspect heroin, chest x-ray without acute findings    Review of systems:    In addition to the HPI above,   A full Review of  Systems was done, all other systems reviewed are negative except as noted above in HPI , .    Social History:  Reviewed by me    Social History   Tobacco Use  . Smoking status: Former Smoker    Packs/day: 0.10    Types: Cigarettes  . Smokeless tobacco: Never Used  Substance Use Topics  . Alcohol use: No    Comment: socially     Family History :  Reviewed by  me  HTN first-degree relatives   Home Medications:   Prior to Admission medications   Medication Sig Start Date End Date Taking? Authorizing Provider  Ferrous Fumarate (HEMOCYTE - 106 MG FE) 324 (106 Fe) MG TABS tablet Take 1 tablet (106 mg of iron total) by mouth 2 (two) times daily. Patient not taking: Reported on 02/23/2018 07/06/16   Arabella Merles, CNM  ketorolac (TORADOL) 10 MG tablet Take 1 tablet (10 mg total) by mouth every 6 (six) hours. Patient not taking: Reported on 02/23/2018 07/06/16   Arabella Merles, CNM  oxyCODONE 10 MG TABS Take 1 tablet (10 mg total) by mouth every 4 (four) hours as needed (pain scale > 7). Patient not taking: Reported on 02/23/2018 07/06/16   Arabella Merles, CNM     Allergies:    No Known Allergies   Physical Exam:   Vitals  Blood pressure 103/68, pulse 62, temperature (!) 96 F (35.6 C), temperature source Axillary, resp. rate 14, height 5\' 4"  (1.626 m), weight 68 kg (150 lb), last menstrual period 02/16/2018, SpO2 98 %, unknown if currently breastfeeding.  Physical Examination: General appearance -   waking up a bit more, and in no distress and  Mental status - alert, oriented to person, place, and time,  Eyes - sclera anicteric Neck - supple, no JVD elevation , Chest - clear  to auscultation bilaterally, symmetrical air movement,  Heart - S1 and S2 normal,  Abdomen - soft, nontender, nondistended, no masses or organomegaly Neurological - screening mental status exam normal, neck supple without rigidity, cranial nerves II through XII intact, DTR's normal and symmetric Extremities - no pedal edema noted, intact peripheral pulses  Skin - warm, dry    Data Review:    CBC Recent Labs  Lab 02/23/18 0242  WBC 15.2*  HGB 11.5*  HCT 37.2  PLT 214  MCV 88.4  MCH 27.3  MCHC 30.9  RDW 14.0  LYMPHSABS 1.0  MONOABS 1.3*  EOSABS 0.0  BASOSABS 0.1   ------------------------------------------------------------------------------------------------------------------  Chemistries  Recent Labs  Lab 02/23/18 0242  NA 136  K 3.5  CL 97*  CO2 25  GLUCOSE 187*  BUN 7  CREATININE 1.35*  CALCIUM 9.0   ------------------------------------------------------------------------------------------------------------------ estimated creatinine clearance is 58.2 mL/min (A) (by C-G formula based on SCr of 1.35 mg/dL (H)). ------------------------------------------------------------------------------------------------------------------ No results for input(s): TSH, T4TOTAL, T3FREE, THYROIDAB in the last 72 hours.  Invalid input(s): FREET3   Coagulation profile No results for input(s): INR, PROTIME in the last 168 hours. ------------------------------------------------------------------------------------------------------------------- No results for input(s): DDIMER in the last 72 hours. -------------------------------------------------------------------------------------------------------------------  Cardiac Enzymes No results for input(s): CKMB, TROPONINI, MYOGLOBIN in the last 168  hours.  Invalid input(s): CK ------------------------------------------------------------------------------------------------------------------ No results found for: BNP  ---------------------------------------------------------------------------------------------------------------  Urinalysis    Component Value Date/Time   COLORURINE YELLOW 08/07/2015 1155   APPEARANCEUR CLEAR 08/07/2015 1155   LABSPEC 1.015 05/08/2016 0850   PHURINE 8.5 (H) 05/08/2016 0850   GLUCOSEU NEGATIVE 05/08/2016 0850   HGBUR NEGATIVE 05/08/2016 0850   BILIRUBINUR NEGATIVE 05/08/2016 0850   KETONESUR NEGATIVE 05/08/2016 0850   PROTEINUR NEGATIVE 05/08/2016 0850   UROBILINOGEN 1.0 05/08/2016 0850   NITRITE NEGATIVE 05/08/2016 0850   LEUKOCYTESUR NEGATIVE 05/08/2016 0850   ----------------------------------------------------------------------------------------------------------------   Imaging Results:    Dg Chest Portable 1 View  Result Date: 02/23/2018 CLINICAL DATA:  Drug overdose EXAM: PORTABLE CHEST 1 VIEW COMPARISON:  None. FINDINGS: The heart size and mediastinal contours are within normal limits. Both lungs  are clear. The visualized skeletal structures are unremarkable. IMPRESSION: No active disease. Electronically Signed   By: Tollie Ethavid  Kwon M.D.   On: 02/23/2018 03:05    Radiological Exams on Admission: Dg Chest Portable 1 View  Result Date: 02/23/2018 CLINICAL DATA:  Drug overdose EXAM: PORTABLE CHEST 1 VIEW COMPARISON:  None. FINDINGS: The heart size and mediastinal contours are within normal limits. Both lungs are clear. The visualized skeletal structures are unremarkable. IMPRESSION: No active disease. Electronically Signed   By: Tollie Ethavid  Kwon M.D.   On: 02/23/2018 03:05    DVT Prophylaxis -SCD  /Heparin AM Labs Ordered, also please review Full Orders  Family Communication: Admission, patients condition and plan of care including tests being ordered have been discussed with the patient who  indicate understanding and agree with the plan   Code Status - Full Code  Likely DC to  Home   Condition   stable  Shon Haleourage Matilde Markie M.D on 02/23/2018 at 11:06 AM  Go to [email or web address] - password [email or web address] for contact info  [facility] - Office  [phone]

## 2018-02-23 NOTE — ED Notes (Signed)
Paged MD RE diet and orders.

## 2018-02-24 DIAGNOSIS — T50904A Poisoning by unspecified drugs, medicaments and biological substances, undetermined, initial encounter: Secondary | ICD-10-CM

## 2018-02-24 LAB — BASIC METABOLIC PANEL
ANION GAP: 8 (ref 5–15)
CALCIUM: 8.9 mg/dL (ref 8.9–10.3)
CO2: 28 mmol/L (ref 22–32)
Chloride: 105 mmol/L (ref 98–111)
Creatinine, Ser: 0.89 mg/dL (ref 0.44–1.00)
GFR calc Af Amer: >60 mL/min (ref >60)
GFR calc non Af Amer: >60 mL/min (ref >60)
GLUCOSE: 97 mg/dL (ref 70–99)
POTASSIUM: 3.9 mmol/L (ref 3.5–5.1)
Sodium: 141 mmol/L (ref 135–145)

## 2018-02-24 LAB — CBC
HCT: 32.6 % — ABNORMAL LOW (ref 36.0–46.0)
HEMOGLOBIN: 10.2 g/dL — AB (ref 12.0–15.0)
MCH: 27.2 pg (ref 26.0–34.0)
MCHC: 31.3 g/dL (ref 30.0–36.0)
MCV: 86.9 fL (ref 78.0–100.0)
Platelets: 199 10*3/uL (ref 150–400)
RBC: 3.75 MIL/uL — ABNORMAL LOW (ref 3.87–5.11)
RDW: 14.2 % (ref 11.5–15.5)
WBC: 6.5 10*3/uL (ref 4.0–10.5)

## 2018-02-24 LAB — HIV ANTIBODY (ROUTINE TESTING W REFLEX): HIV SCREEN 4TH GENERATION: NONREACTIVE

## 2018-02-24 NOTE — Progress Notes (Signed)
Kelly Gordon to be D/C'd Home per MD order.  Discussed with the patient and all questions fully answered.  VSS, Skin clean, dry and intact without evidence of skin break down, no evidence of skin tears noted. IV catheter discontinued intact. Site without signs and symptoms of complications. Dressing and pressure applied.  An After Visit Summary was printed and given to the patient. Patient received prescription.  D/c education completed with patient/family including follow up instructions, medication list, d/c activities limitations if indicated, with other d/c instructions as indicated by MD - patient able to verbalize understanding, all questions fully answered.   Patient instructed to return to ED, call 911, or call MD for any changes in condition.   Patient escorted via WC, and D/C home via private auto.  Marca AnconaLaura M Melvena Vink 02/24/2018 10:46 AM

## 2018-02-24 NOTE — Progress Notes (Signed)
Patient is waiting on ride.

## 2018-02-24 NOTE — Discharge Summary (Signed)
Physician Discharge Summary  Kelly Gordon ZOX:[id number] DOB: Apr 26, 1991 DOA: 02/23/2018  PCP: Patient, No Pcp Per  Admit date: 02/23/2018 Discharge date: 02/24/2018  Admitted From: Home Disposition:  Home  Discharge Condition:Stable CODE STATUS:FULL Diet recommendation: Heart Healthy  Brief/Interim Summary: Admission H and P:  Kelly Gordon  is a 27 y.o. female  With h/o drug abuse, who presents with heroin and Cocaine overdose. Patient was sluggish, but oriented x3, arousable. patient received intranasal Narcan from the fire department prior to arriving the she is waking up more,. Patient has nausea and vomited in the ED.  Patient was started with Unasyn by ED  physician. History is limited as patient is not very conversational, she is not really willing to talk, she just wants to be left alone, she is unhappy about lack of food At the time of my evaluation patient is currently off oxygen, waking up a bit more, no fevers In ED--- UDS is positive for cocaine and opiates, suspect heroin, chest x-ray without acute findings  Hospital Course:  Patient seen and examined the bedside this morning.  Hemodynamically stable.  No new complaints.  Her acute kidney injury has resolved.  Leukocytosis resolved.  Patient is stable for discharge to home today.  Following problems were addressed during hospitalization:  1)AKI----acute kidney injury ,  creatinine on admission= 1.35 , baseline creatinine = 0.76   .  Resolved with hydration  2)Heroin and Cocaine overdose----patient received intranasal Narcan from the fire department.  Currently she is alert and oriented.  Counseled for drug cessation  3)Emesis and possible aspiration----no hypoxia at this time, auscultatively lungs are mostly clear, patient received IV Unasyn in the ED, will hold off on further antibiotics for now.   Chest x-ray is clear.  Leukocytosis resolved.  Discharge Diagnoses:  Active Problems:   Drug overdose   AKI  (acute kidney injury) [facility])    Discharge Instructions  Discharge Instructions    Diet - low sodium heart healthy   Complete by:  As directed    Discharge instructions   Complete by:  As directed    1) Follow up with a PCP as an outpatient in a week. 2) Please stop drug abuse.   Increase activity slowly   Complete by:  As directed      Allergies as of 02/24/2018   No Known Allergies     Medication List    TAKE these medications   Ferrous Fumarate 324 (106 Fe) MG Tabs tablet Commonly known as:  HEMOCYTE - 106 mg FE Take 1 tablet (106 mg of iron total) by mouth 2 (two) times daily.   ketorolac 10 MG tablet Commonly known as:  TORADOL Take 1 tablet (10 mg total) by mouth every 6 (six) hours.   Oxycodone HCl 10 MG Tabs Take 1 tablet (10 mg total) by mouth every 4 (four) hours as needed (pain scale > 7).       No Known Allergies  Consultations: None  Procedures/Studies: Dg Chest Portable 1 View  Result Date: 02/23/2018 CLINICAL DATA:  Drug overdose EXAM: PORTABLE CHEST 1 VIEW COMPARISON:  None. FINDINGS: The heart size and mediastinal contours are within normal limits. Both lungs are clear. The visualized skeletal structures are unremarkable. IMPRESSION: No active disease. Electronically Signed   By: Tollie Eth M.D.   On: 02/23/2018 03:05       Subjective:   Discharge Exam: Vitals:   02/23/18 2158 02/24/18 0546  BP: 99/63 104/63  Pulse: 65 61  Resp:  16 16  Temp: 99.1 F (37.3 C) 98.1 F (36.7 C)  SpO2: 99% 100%   Vitals:   02/23/18 1516 02/23/18 1531 02/23/18 2158 02/24/18 0546  BP: 96/60  99/63 104/63  Pulse: 84  65 61  Resp: 17  16 16   Temp: 99.3 F (37.4 C)  99.1 F (37.3 C) 98.1 F (36.7 C)  TempSrc:   Oral Oral  SpO2: 100% 99% 99% 100%  Weight:      Height:        General: Pt is alert, awake, not in acute distress Cardiovascular: RRR, S1/S2 +, no rubs, no gallops Respiratory: CTA bilaterally, no wheezing, no rhonchi Abdominal: Soft, NT,  ND, bowel sounds + Extremities: no edema, no cyanosis    The results of significant diagnostics from this hospitalization (including imaging, microbiology, ancillary and laboratory) are listed below for reference.     Microbiology: No results found for this or any previous visit (from the past 240 hour(s)).   Labs: BNP (last 3 results) No results for input(s): BNP in the last 8760 hours. Basic Metabolic Panel: Recent Labs  Lab 02/23/18 0242 02/23/18 1547 02/24/18 0550  NA 136  --  141  K 3.5  --  3.9  CL 97*  --  105  CO2 25  --  28  GLUCOSE 187*  --  97  BUN 7  --  <5*  CREATININE 1.35* 0.97 0.89  CALCIUM 9.0  --  8.9   Liver Function Tests: No results for input(s): AST, ALT, ALKPHOS, BILITOT, PROT, ALBUMIN in the last 168 hours. No results for input(s): LIPASE, AMYLASE in the last 168 hours. No results for input(s): AMMONIA in the last 168 hours. CBC: Recent Labs  Lab 02/23/18 0242 02/23/18 1547 02/24/18 0550  WBC 15.2* 9.7 6.5  NEUTROABS 12.8*  --   --   HGB 11.5* 10.7* 10.2*  HCT 37.2 34.0* 32.6*  MCV 88.4 86.5 86.9  PLT 214 208 199   Cardiac Enzymes: No results for input(s): CKTOTAL, CKMB, CKMBINDEX, TROPONINI in the last 168 hours. BNP: Invalid input(s): POCBNP CBG: No results for input(s): GLUCAP in the last 168 hours. D-Dimer No results for input(s): DDIMER in the last 72 hours. Hgb A1c No results for input(s): HGBA1C in the last 72 hours. Lipid Profile No results for input(s): CHOL, HDL, LDLCALC, TRIG, CHOLHDL, LDLDIRECT in the last 72 hours. Thyroid function studies No results for input(s): TSH, T4TOTAL, T3FREE, THYROIDAB in the last 72 hours.  Invalid input(s): FREET3 Anemia work up No results for input(s): VITAMINB12, FOLATE, FERRITIN, TIBC, IRON, RETICCTPCT in the last 72 hours. Urinalysis    Component Value Date/Time   COLORURINE YELLOW 08/07/2015 1155   APPEARANCEUR CLEAR 08/07/2015 1155   LABSPEC 1.015 05/08/2016 0850   PHURINE 8.5  (H) 05/08/2016 0850   GLUCOSEU NEGATIVE 05/08/2016 0850   HGBUR NEGATIVE 05/08/2016 0850   BILIRUBINUR NEGATIVE 05/08/2016 0850   KETONESUR NEGATIVE 05/08/2016 0850   PROTEINUR NEGATIVE 05/08/2016 0850   UROBILINOGEN 1.0 05/08/2016 0850   NITRITE NEGATIVE 05/08/2016 0850   LEUKOCYTESUR NEGATIVE 05/08/2016 0850   Sepsis Labs Invalid input(s): PROCALCITONIN,  WBC,  LACTICIDVEN Microbiology No results found for this or any previous visit (from the past 240 hour(s)).  Please note: You were cared for by a hospitalist during your hospital stay. Once you are discharged, your primary care physician will handle any further medical issues. Please note that NO REFILLS for any discharge medications will be authorized once you are discharged, as it  is imperative that you return to your primary care physician (or establish a relationship with a primary care physician if you do not have one) for your post hospital discharge needs so that they can reassess your need for medications and monitor your lab values.    Time coordinating discharge: 40 minutes  SIGNED:   Burnadette PopAmrit Akeisha Lagerquist, MD  [facility] 02/24/2018, 8:44 AM Pager [phone]  If 7PM-7AM, please contact night-coverage [email or web address] Password [email or web address]

## 2018-02-28 LAB — CULTURE, BLOOD (ROUTINE X 2)
CULTURE: NO GROWTH
CULTURE: NO GROWTH
Special Requests: ADEQUATE
Special Requests: ADEQUATE

## 2018-05-22 ENCOUNTER — Emergency Department (HOSPITAL_COMMUNITY)
Admission: EM | Admit: 2018-05-22 | Discharge: 2018-05-22 | Disposition: A | Discharge status: Home / Self Care | Payer: Medicaid Other | Attending: Emergency Medicine | Admitting: Emergency Medicine

## 2018-05-22 ENCOUNTER — Encounter (HOSPITAL_COMMUNITY): Payer: Self-pay

## 2018-05-22 ENCOUNTER — Other Ambulatory Visit: Payer: Self-pay

## 2018-05-22 DIAGNOSIS — F1110 Opioid abuse, uncomplicated: Secondary | ICD-10-CM

## 2018-05-22 DIAGNOSIS — Z87891 Personal history of nicotine dependence: Secondary | ICD-10-CM | POA: Insufficient documentation

## 2018-05-22 DIAGNOSIS — T50901A Poisoning by unspecified drugs, medicaments and biological substances, accidental (unintentional), initial encounter: Secondary | ICD-10-CM | POA: Diagnosis not present

## 2018-05-22 HISTORY — DX: Opioid abuse, uncomplicated: F11.10

## 2018-05-22 MED ORDER — NALOXONE HCL 4 MG/0.1ML NA LIQD
1.0000 | Freq: Once | NASAL | Status: AC
  Administered 2018-05-22: 1 via NASAL
  Filled 2018-05-22: qty 4

## 2018-05-22 NOTE — ED Notes (Signed)
Bed: [facility] Expected date: 05/22/18 Expected time: 6:56 PM Means of arrival: Ambulance Comments: Heroin OD, alert after Narcan

## 2018-05-22 NOTE — ED Provider Notes (Signed)
[facility]-EMERGENCY DEPT Provider Note   CSN: [id number] Arrival date & time: 05/22/18  1856     History   Chief Complaint Chief Complaint  Patient presents with  . Drug Overdose    HPI Kelly Gordon is a 27 y.o. female with history of heroin and cocaine abuse presents for evaluation of accidental overdose of cocaine.  Per triage note, family called 911 due to patient being found unresponsive.  [facility] gave 1 mg intranasal Narcan and assisted respirations of bag-valve-mask.  At the time she was breathing on her own, no compressions were done.  She denies intentional overdose.  She denies suicidal ideation, homicidal ideation, auditory or visual hallucinations.  Presently she denies any nausea, vomiting, chest pain, shortness of breath, or any other complaints at this time.  She does note feeling somewhat sleepy. Denies any other drug use.  The history is provided by the patient.    Past Medical History:  Diagnosis Date  . Heroin abuse (HCC) 05/22/2018  . History of cocaine use    12/2015 NOB [facility] UDS  . Vaginal delivery 2009    Patient Active Problem List   Diagnosis Date Noted  . Drug overdose 02/23/2018  . AKI (acute kidney injury) (HCC) 02/23/2018  . History of cesarean delivery, currently pregnant 07/03/2016  . History of cocaine use   . History of cesarean delivery, antepartum 04/15/2016  . Supervision of high risk pregnancy, antepartum 02/20/2016  . History of preterm delivery 02/20/2016  . H/O herpes genitalis 02/20/2016    Past Surgical History:  Procedure Laterality Date  . CESAREAN SECTION    . CESAREAN SECTION N/A 07/03/2016   Procedure: CESAREAN SECTION with BTL;  Surgeon: Logan Bing, MD;  Location: [facility];  Service: Obstetrics;  Laterality: N/A;     OB History    Gravida  5   Para  3   Term  2   Preterm  1   AB  2   Living  3     SAB  2   TAB      Ectopic      Multiple  0   Live  Births  3            Home Medications    Prior to Admission medications   Medication Sig Start Date End Date Taking? Authorizing Provider  Ferrous Fumarate (HEMOCYTE - 106 MG FE) 324 (106 Fe) MG TABS tablet Take 1 tablet (106 mg of iron total) by mouth 2 (two) times daily. Patient not taking: Reported on 02/23/2018 07/06/16   Arabella Merles, CNM  ketorolac (TORADOL) 10 MG tablet Take 1 tablet (10 mg total) by mouth every 6 (six) hours. Patient not taking: Reported on 02/23/2018 07/06/16   Arabella Merles, CNM  oxyCODONE 10 MG TABS Take 1 tablet (10 mg total) by mouth every 4 (four) hours as needed (pain scale > 7). Patient not taking: Reported on 02/23/2018 07/06/16   Arabella Merles, CNM    Family History No family history on file.  Social History Social History   Tobacco Use  . Smoking status: Former Smoker    Packs/day: 0.10    Types: Cigarettes  . Smokeless tobacco: Never Used  Substance Use Topics  . Alcohol use: No    Comment: socially  . Drug use: No     Allergies   Patient has no known allergies.   Review of Systems Review of Systems  Constitutional: Negative for  chills and fever.  Respiratory: Negative for shortness of breath.   Cardiovascular: Negative for chest pain.  Gastrointestinal: Negative for abdominal pain, nausea and vomiting.  Neurological:       +mental status change, resolved  All other systems reviewed and are negative.    Physical Exam Updated Vital Signs BP 102/72 (BP Location: Right Arm)   Pulse 71   Temp 98.5 F (36.9 C) (Oral)   Resp 16   Ht 5\' 4"  (1.626 m)   Wt 68 kg   SpO2 100%   BMI 25.73 kg/m   Physical Exam  Constitutional: She is oriented to person, place, and time. She appears well-developed and well-nourished. No distress.  Resting comfortably in chair  HENT:  Head: Normocephalic and atraumatic.  Eyes: Pupils are equal, round, and reactive to light. Conjunctivae and EOM are normal. Right eye exhibits no  discharge. Left eye exhibits no discharge.  Neck: Normal range of motion. Neck supple. No JVD present. No tracheal deviation present.  Tolerating secretions without difficulty.  Cardiovascular: Normal rate, regular rhythm, normal heart sounds and intact distal pulses.  Pulmonary/Chest: Effort normal and breath sounds normal.  Abdominal: Soft. Bowel sounds are normal. She exhibits no distension. There is no tenderness. There is no guarding.  Musculoskeletal: Normal range of motion. She exhibits no edema.  No midline spine TTP, no paraspinal muscle tenderness, no deformity, crepitus, or step-off noted. 5/5 strength of BUE and BLE major muscle groups.   Neurological: She is alert and oriented to person, place, and time. No cranial nerve deficit or sensory deficit. She exhibits normal muscle tone.  Mental Status:  Sleepy but easily arousable, thought content appropriate, able to give a coherent history. Speech fluent without evidence of aphasia. Able to follow 2 step commands without difficulty.  Cranial Nerves:  II:  Peripheral visual fields grossly normal, pupils equal, round, reactive to light III,IV, VI: ptosis not present, extra-ocular motions intact bilaterally  V,VII: smile symmetric, facial light touch sensation equal VIII: hearing grossly normal to voice  X: uvula elevates symmetrically  XI: bilateral shoulder shrug symmetric and strong XII: midline tongue extension without fassiculations Motor:  Normal tone. 5/5 strength of BUE and BLE major muscle groups including strong and equal grip strength and dorsiflexion/plantar flexion Sensory: light touch normal in all extremities. Gait: normal gait and balance. Able to walk on toes and heels with ease.     Skin: Skin is warm and dry. No erythema.  Psychiatric: She has a normal mood and affect. Her behavior is normal.  Nursing note and vitals reviewed.    ED Treatments / Results  Labs (all labs ordered are listed, but only abnormal  results are displayed) Labs Reviewed - No data to display  EKG None  Radiology No results found.  Procedures Procedures (including critical care time)  Medications Ordered in ED Medications  naloxone (NARCAN) nasal spray 4 mg/0.1 mL (1 spray Nasal Provided for home use 05/22/18 2103)     Initial Impression / Assessment and Plan / ED Course  I have reviewed the triage vital signs and the nursing notes.  Pertinent labs & imaging results that were available during my care of the patient were reviewed by me and considered in my medical decision making (see chart for details).     Patient presents for evaluation after accidental drug overdose.  Found unresponsive and family called EMS.  She regained consciousness after intranasal Narcan.  She is afebrile, vital signs are stable.  She is nontoxic in  appearance.  No focal neurologic deficits.  She states she feels a little bit sleepy but otherwise has no complaints.  He denies suicidal ideation or homicidal ideation.  She does not appear to be an imminent threat to herself or others at this time.  She was observed for over an hour with stable SPO2 saturations.  Lungs clear to auscultation bilaterally.  Doubt aspiration pneumonia.  I do not see need for lab work at this time.  She is ambulatory without difficulty.  She does express to me a desire to quit using drugs.  A peer support specialist consult was placed and she was given intranasal Narcan to take home in the event that she has another drug overdose.  Is also given outpatient substance abuse resources.  Discussed strict ED return precautions. Pt verbalized understanding of and agreement with plan and is safe for discharge home at this time.   Final Clinical Impressions(s) / ED Diagnoses   Final diagnoses:  Acute drug overdose, accidental or unintentional, initial encounter    ED Discharge Orders    None       Bennye Alm 05/22/18 2319    Wynetta Fines, MD 05/23/18  1453

## 2018-05-22 NOTE — Discharge Instructions (Signed)
I do recommend that you stop using drugs. However, if you do accidentally overdose again, we have provided you with Narcan to take home that someone can administer for you. This could potentially save your life. I have also contacted our Peer Support Specialists to reach out to you for further assistance. See the attached information for outpatient substance abuse resources.  Return to the emergency department if any concerning signs or symptoms develop such as difficulty breathing, or lack of responsiveness.

## 2018-05-22 NOTE — ED Triage Notes (Signed)
Patient comes in via [facility] from home. Family called 911 due to patient being found unresponsive. [facility] gave 1mg  Intranasal and bagged patient. When EMS showed up patient was AOx4 and sitting up breathing on own. No chest compressions were done. Patient denies any allergies, Medical Hx or meds. Patient does admit to snorting Heroin and Cocaine. Patient is currently AOx4 and ambulatory.

## 2019-01-20 ENCOUNTER — Other Ambulatory Visit: Payer: Self-pay

## 2019-01-20 ENCOUNTER — Encounter (HOSPITAL_COMMUNITY): Payer: Self-pay | Admitting: Emergency Medicine

## 2019-01-20 ENCOUNTER — Emergency Department (HOSPITAL_COMMUNITY)
Admission: EM | Admit: 2019-01-20 | Discharge: 2019-01-20 | Disposition: A | Discharge status: Home / Self Care | Payer: Medicaid Other | Attending: Emergency Medicine | Admitting: Emergency Medicine

## 2019-01-20 DIAGNOSIS — Z5321 Procedure and treatment not carried out due to patient leaving prior to being seen by health care provider: Secondary | ICD-10-CM | POA: Diagnosis not present

## 2019-01-20 DIAGNOSIS — M542 Cervicalgia: Secondary | ICD-10-CM | POA: Insufficient documentation
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
## 2019-01-20 NOTE — ED Notes (Signed)
Patient called 3x in waiting area, no answer

## 2019-09-07 ENCOUNTER — Other Ambulatory Visit: Payer: Self-pay

## 2019-09-07 ENCOUNTER — Encounter (HOSPITAL_COMMUNITY): Payer: Self-pay

## 2019-09-07 ENCOUNTER — Ambulatory Visit (HOSPITAL_COMMUNITY)
Admission: EM | Admit: 2019-09-07 | Discharge: 2019-09-07 | Disposition: A | Discharge status: Home / Self Care | Payer: Medicaid Other | Attending: Family Medicine | Admitting: Family Medicine

## 2019-09-07 DIAGNOSIS — N898 Other specified noninflammatory disorders of vagina: Secondary | ICD-10-CM | POA: Insufficient documentation

## 2019-09-07 DIAGNOSIS — Z202 Contact with and (suspected) exposure to infections with a predominantly sexual mode of transmission: Secondary | ICD-10-CM | POA: Diagnosis not present

## 2019-09-07 LAB — POCT URINALYSIS DIP (DEVICE)
Bilirubin Urine: NEGATIVE
Glucose, UA: NEGATIVE mg/dL
Ketones, ur: NEGATIVE mg/dL
Nitrite: NEGATIVE
Protein, ur: NEGATIVE mg/dL
Specific Gravity, Urine: 1.01 (ref 1.005–1.030)
Urobilinogen, UA: 0.2 mg/dL (ref 0.0–1.0)
pH: 7 (ref 5.0–8.0)

## 2019-09-07 MED ORDER — CEFTRIAXONE SODIUM 500 MG IJ SOLR
500.0000 mg | Freq: Once | INTRAMUSCULAR | Status: AC
  Administered 2019-09-07: 12:00:00 500 mg via INTRAMUSCULAR

## 2019-09-07 MED ORDER — DOXYCYCLINE HYCLATE 100 MG PO CAPS: 100.0000 mg | ORAL_CAPSULE | Freq: Two times a day (BID) | ORAL | 0 refills | Status: DC

## 2019-09-07 MED ORDER — CEFTRIAXONE SODIUM 500 MG IJ SOLR
INTRAMUSCULAR | Status: AC
  Filled 2019-09-07: qty 500

## 2019-09-07 MED ORDER — LIDOCAINE HCL (PF) 1 % IJ SOLN
INTRAMUSCULAR | Status: AC
  Filled 2019-09-07: qty 2

## 2019-09-07 NOTE — ED Triage Notes (Addendum)
Pt state she was told by her partner that he has Gonorrhea. Pt state she  need to be tested and treated .

## 2019-09-07 NOTE — ED Provider Notes (Signed)
[facility] CARE CENTER   [id number] 09/07/19 Arrival Time: 1055  ASSESSMENT & PLAN:  1. Vaginal discharge   2. Possible exposure to STD       Discharge Instructions     You have been given the following today for treatment of suspected gonorrhea and/or chlamydia:  cefTRIAXone (ROCEPHIN) injection 500 mg  Please pick up your prescription for doxycycline 100 mg and begin taking twice daily for the next seven (7) days.  Even though we have treated you today, we have sent testing for sexually transmitted infections. We will notify you of any positive results once they are received. If required, we will prescribe any medications you might need.  Please refrain from all sexual activity for at least the next seven days.     Without s/s of PID.  Labs Reviewed  POCT URINALYSIS DIP (DEVICE) - Abnormal; Notable for the following components:      Result Value   Hgb urine dipstick TRACE (*)    Leukocytes,Ua SMALL (*)    All other components within normal limits  URINE CULTURE  CERVICOVAGINAL ANCILLARY ONLY    Will notify of any positive results. Instructed to refrain from sexual activity for at least seven days.  Reviewed expectations re: course of current medical issues. Questions answered. Outlined signs and symptoms indicating need for more acute intervention. Patient verbalized understanding. After Visit Summary given.   SUBJECTIVE:  Kelly Gordon is a 29 y.o. female who presents with complaint of vaginal discharge. Sexual partner called her and informed that he had tested + for gonorrhea. Onset gradual. First noticed one w ago. Non-specific with her description of discharge but abnormal for her. No specific aggravating or alleviating factors reported. Denies: urinary frequency, dysuria and gross hematuria. Afebrile. No abdominal or pelvic pain. Normal PO intake wihout n/v. No genital rashes or lesions. OTC treatment: none. History of STI: in the very distant past.  No  LMP recorded. Patient has had an injection.   OBJECTIVE:  Vitals:   09/07/19 1114 09/07/19 1116  BP:  119/75  Pulse:  76  Resp:  18  Temp:  99 F (37.2 C)  TempSrc:  Oral  SpO2:  100%  Weight: 66.7 kg      General appearance: alert, cooperative, appears stated age and no distress Lungs: unlabored respirations; speaks full sentences without difficulty Back: no CVA tenderness; FROM at waist Abdomen: soft, non-tender GU: deferred Skin: warm and dry Psychological: alert and cooperative; normal mood and affect.   No Known Allergies  Past Medical History:  Diagnosis Date  . Heroin abuse (HCC) 05/22/2018  . History of cocaine use    12/2015 NOB [facility] UDS  . Vaginal delivery 2009   History reviewed. No pertinent family history.   Social History   Socioeconomic History  . Marital status: Married    Spouse name: Not on file  . Number of children: Not on file  . Years of education: Not on file  . Highest education level: Not on file  Occupational History  . Not on file  Tobacco Use  . Smoking status: Former Smoker    Packs/day: 0.10    Types: Cigarettes  . Smokeless tobacco: Never Used  Substance and Sexual Activity  . Alcohol use: No    Comment: socially  . Drug use: No  . Sexual activity: Yes    Birth control/protection: None  Other Topics Concern  . Not on file  Social History Narrative  . Not on file   Social  Determinants of Health   Financial Resource Strain:   . Difficulty of Paying Living Expenses: Not on file  Food Insecurity:   . Worried About Charity fundraiser in the Last Year: Not on file  . Ran Out of Food in the Last Year: Not on file  Transportation Needs:   . Lack of Transportation (Medical): Not on file  . Lack of Transportation (Non-Medical): Not on file  Physical Activity:   . Days of Exercise per Week: Not on file  . Minutes of Exercise per Session: Not on file  Stress:   . Feeling of Stress : Not on file  Social Connections:   .  Frequency of Communication with Friends and Family: Not on file  . Frequency of Social Gatherings with Friends and Family: Not on file  . Attends Religious Services: Not on file  . Active Member of Clubs or Organizations: Not on file  . Attends Archivist Meetings: Not on file  . Marital Status: Not on file  Intimate Partner Violence:   . Fear of Current or Ex-Partner: Not on file  . Emotionally Abused: Not on file  . Physically Abused: Not on file  . Sexually Abused: Not on file          Vanessa Kick, MD 09/07/19 1148

## 2019-09-07 NOTE — Discharge Instructions (Addendum)

## 2019-09-08 LAB — URINE CULTURE

## 2019-09-09 LAB — CERVICOVAGINAL ANCILLARY ONLY
Bacterial vaginitis: POSITIVE — AB
Candida vaginitis: POSITIVE — AB
Chlamydia: NEGATIVE
Neisseria Gonorrhea: POSITIVE — AB
Trichomonas: NEGATIVE

## 2019-09-11 ENCOUNTER — Encounter (HOSPITAL_COMMUNITY): Payer: Self-pay

## 2019-09-11 ENCOUNTER — Telehealth (HOSPITAL_COMMUNITY): Payer: Self-pay | Admitting: Emergency Medicine

## 2019-09-11 MED ORDER — FLUCONAZOLE 150 MG PO TABS: 150.0000 mg | ORAL_TABLET | Freq: Once | ORAL | 0 refills | Status: AC

## 2019-09-11 MED ORDER — METRONIDAZOLE 500 MG PO TABS: 500.0000 mg | ORAL_TABLET | Freq: Two times a day (BID) | ORAL | 0 refills | Status: AC

## 2019-09-11 NOTE — Telephone Encounter (Signed)
Test for gonorrhea was positive. This was treated at the urgent care visit with IM rocephin 500mg . Please refrain from sexual intercourse for 7 days after treatment to give the medicine time to work. Sexual partners need to be notified and tested/treated. Condoms may reduce risk of reinfection. Recheck or followup with PCP for further evaluation if symptoms are not improving. [facility] notified.   Test for candida (yeast) was positive.  Prescription for fluconazole 150mg  po now, repeat dose in 3d if needed, #2 no refills, sent to the pharmacy of record.  Recheck or followup with PCP for further evaluation if symptoms are not improving.    Bacterial vaginosis is positive. Pt needs treatment. Flagyl 500 mg BID x 7 days #14 no refills sent to patients pharmacy of choice.    Attempted to reach patient. No answer at this time. Voicemail left.   If you have any questions, you may call me at [phone]

## 2019-09-11 NOTE — Telephone Encounter (Signed)
Patient contacted by phone and made aware of  cytology  results. Pt verbalized understanding and had all questions answered.    

## 2019-10-20 ENCOUNTER — Other Ambulatory Visit: Payer: Self-pay

## 2019-10-20 ENCOUNTER — Encounter (HOSPITAL_COMMUNITY): Payer: Self-pay

## 2019-10-20 ENCOUNTER — Ambulatory Visit (HOSPITAL_COMMUNITY)
Admission: EM | Admit: 2019-10-20 | Discharge: 2019-10-20 | Disposition: A | Discharge status: Home / Self Care | Payer: Medicaid Other | Attending: Family Medicine | Admitting: Family Medicine

## 2019-10-20 DIAGNOSIS — N898 Other specified noninflammatory disorders of vagina: Secondary | ICD-10-CM

## 2019-10-20 MED ORDER — FLUCONAZOLE 150 MG PO TABS: 150.0000 mg | ORAL_TABLET | Freq: Once | ORAL | 0 refills | Status: AC

## 2019-10-20 MED ORDER — METRONIDAZOLE 0.75 % VA GEL: 1.0000 | Freq: Every day | VAGINAL | 0 refills | Status: AC

## 2019-10-20 NOTE — ED Provider Notes (Signed)
[facility]    CSN: [id number] Arrival date & time: 10/20/19  1412      History   Chief Complaint Chief Complaint  Patient presents with  . Vaginal Discharge    HPI Kelly Gordon is a 29 y.o. female presenting today for evaluation of vaginal discharge.  Patient notes over the past week she has had a discharge which she describes as being gray.  She has had associated irritation and odor with this.  She is was using Monistat, but feels symptoms may be bacterial vaginosis.  She tested positive for gonorrhea, BV and yeast on 2/18 and completed treatment for this.  Is on Depo-Provera for birth control, next injection due on 4/12.  Denies urinary symptoms.  HPI  Past Medical History:  Diagnosis Date  . Heroin abuse (HCC) 05/22/2018  . History of cocaine use    12/2015 NOB [facility] UDS  . Vaginal delivery 2009    Patient Active Problem List   Diagnosis Date Noted  . Drug overdose 02/23/2018  . AKI (acute kidney injury) (HCC) 02/23/2018  . History of cesarean delivery, currently pregnant 07/03/2016  . History of cocaine use   . History of cesarean delivery, antepartum 04/15/2016  . Supervision of high risk pregnancy, antepartum 02/20/2016  . History of preterm delivery 02/20/2016  . H/O herpes genitalis 02/20/2016    Past Surgical History:  Procedure Laterality Date  . CESAREAN SECTION    . CESAREAN SECTION N/A 07/03/2016   Procedure: CESAREAN SECTION with BTL;  Surgeon: Waianae Bing, MD;  Location: [facility];  Service: Obstetrics;  Laterality: N/A;    OB History    Gravida  5   Para  3   Term  2   Preterm  1   AB  2   Living  3     SAB  2   TAB      Ectopic      Multiple  0   Live Births  3            Home Medications    Prior to Admission medications   Medication Sig Start Date End Date Taking? Authorizing Provider  Miconazole Nitrate (MONISTAT 3 COMBINATION PACK VA) Place vaginally.   Yes [provider]    fluconazole (DIFLUCAN) 150 MG tablet Take 1 tablet (150 mg total) by mouth once for 1 dose. 10/20/19 10/20/19  Nimrat Woolworth C, PA-C  metroNIDAZOLE (METROGEL VAGINAL) 0.75 % vaginal gel Place 1 Applicatorful vaginally at bedtime for 5 days. 10/20/19 10/25/19  Brinkley Peet C, PA-C  Ferrous Fumarate (HEMOCYTE - 106 MG FE) 324 (106 Fe) MG TABS tablet Take 1 tablet (106 mg of iron total) by mouth 2 (two) times daily. Patient not taking: Reported on 02/23/2018 07/06/16 09/07/19  Arabella Merles, CNM    Family History History reviewed. No pertinent family history.  Social History Social History   Tobacco Use  . Smoking status: Former Smoker    Packs/day: 0.10    Types: Cigarettes  . Smokeless tobacco: Never Used  Substance Use Topics  . Alcohol use: No    Comment: socially  . Drug use: No     Allergies   Patient has no known allergies.   Review of Systems Review of Systems  Constitutional: Negative for fever.  Respiratory: Negative for shortness of breath.   Cardiovascular: Negative for chest pain.  Gastrointestinal: Negative for abdominal pain, diarrhea, nausea and vomiting.  Genitourinary: Positive for vaginal discharge. Negative for dysuria,  flank pain, genital sores, hematuria, menstrual problem, vaginal bleeding and vaginal pain.  Musculoskeletal: Negative for back pain.  Skin: Negative for rash.  Neurological: Negative for dizziness, light-headedness and headaches.     Physical Exam Triage Vital Signs ED Triage Vitals  Enc Vitals Group     BP 10/20/19 1438 (!) 130/91     Pulse Rate 10/20/19 1438 87     Resp 10/20/19 1438 18     Temp 10/20/19 1438 97.7 F (36.5 C)     Temp Source 10/20/19 1438 Oral     SpO2 10/20/19 1438 100 %     Weight --      Height --      Head Circumference --      Peak Flow --      Pain Score 10/20/19 1436 0     Pain Loc --      Pain Edu? --      Excl. in GC? --    No data found.  Updated Vital Signs BP (!) 130/91 (BP Location: Right  Arm)   Pulse 87   Temp 97.7 F (36.5 C) (Oral)   Resp 18   SpO2 100%   Visual Acuity Right Eye Distance:   Left Eye Distance:   Bilateral Distance:    Right Eye Near:   Left Eye Near:    Bilateral Near:     Physical Exam Vitals and nursing note reviewed.  Constitutional:      Appearance: She is well-developed.     Comments: No acute distress  HENT:     Head: Normocephalic and atraumatic.     Nose: Nose normal.  Eyes:     Conjunctiva/sclera: Conjunctivae normal.  Cardiovascular:     Rate and Rhythm: Normal rate.  Pulmonary:     Effort: Pulmonary effort is normal. No respiratory distress.  Abdominal:     General: There is no distension.     Comments: Soft, nondistended, nontender to light and deep palpation throughout abdomen  Musculoskeletal:        General: Normal range of motion.     Cervical back: Neck supple.  Skin:    General: Skin is warm and dry.  Neurological:     Mental Status: She is alert and oriented to person, place, and time.      UC Treatments / Results  Labs (all labs ordered are listed, but only abnormal results are displayed) Labs Reviewed  CERVICOVAGINAL ANCILLARY ONLY    EKG   Radiology No results found.  Procedures Procedures (including critical care time)  Medications Ordered in UC Medications - No data to display  Initial Impression / Assessment and Plan / UC Course  I have reviewed the triage vital signs and the nursing notes.  Pertinent labs & imaging results that were available during my care of the patient were reviewed by me and considered in my medical decision making (see chart for details).     Symptoms suggestive of BV, will treat with MetroGel, also provided Diflucan orally.  Rechecking cervicovaginal swab to ensure resolution of gonorrhea as well as to reevaluate for STDs and confirm vaginitis diagnosis.  Discussed strict return precautions. Patient verbalized understanding and is agreeable with plan.  Final  Clinical Impressions(s) / UC Diagnoses   Final diagnoses:  Vaginal discharge     Discharge Instructions     Please begin using MetroGel at bedtime for the next 5 days, may take 1 tab of Diflucan today  We are testing you for Gonorrhea,  Chlamydia, Trichomonas, Yeast and Bacterial Vaginosis. We will call you if anything is positive and let you know if you require any further treatment. Please inform partners of any positive results.   Please return if symptoms not improving with treatment, development of fever, nausea, vomiting, abdominal pain.    ED Prescriptions    Medication Sig Dispense Auth. Provider   metroNIDAZOLE (METROGEL VAGINAL) 0.75 % vaginal gel Place 1 Applicatorful vaginally at bedtime for 5 days. 70 g Jahdai Padovano C, PA-C   fluconazole (DIFLUCAN) 150 MG tablet Take 1 tablet (150 mg total) by mouth once for 1 dose. 2 tablet Elvena Oyer, [place] C, PA-C     PDMP not reviewed this encounter.   Janith Lima, PA-C 10/20/19 1550

## 2019-10-20 NOTE — Discharge Instructions (Signed)
Please begin using MetroGel at bedtime for the next 5 days, may take 1 tab of Diflucan today  We are testing you for Gonorrhea, Chlamydia, Trichomonas, Yeast and Bacterial Vaginosis. We will call you if anything is positive and let you know if you require any further treatment. Please inform partners of any positive results.   Please return if symptoms not improving with treatment, development of fever, nausea, vomiting, abdominal pain.

## 2019-10-20 NOTE — ED Triage Notes (Addendum)
Pt presents to UC with brown vaginal discharge and itchiness x 1 week.  Pt used Monistat x 3 days, yesterday was the last day, without relief of symptoms.

## 2019-10-23 LAB — CERVICOVAGINAL ANCILLARY ONLY
Bacterial Vaginitis (gardnerella): POSITIVE — AB
Candida Glabrata: NEGATIVE
Candida Vaginitis: NEGATIVE
Chlamydia: POSITIVE — AB
Comment: NEGATIVE
Comment: NEGATIVE
Comment: NEGATIVE
Comment: NEGATIVE
Comment: NEGATIVE
Comment: NORMAL
Neisseria Gonorrhea: NEGATIVE
Trichomonas: NEGATIVE

## 2019-10-24 ENCOUNTER — Telehealth (HOSPITAL_COMMUNITY): Payer: Self-pay

## 2019-10-24 MED ORDER — AZITHROMYCIN 250 MG PO TABS: 1000.0000 mg | ORAL_TABLET | Freq: Once | ORAL | 0 refills | Status: AC

## 2020-04-09 ENCOUNTER — Other Ambulatory Visit: Payer: Self-pay

## 2020-04-09 ENCOUNTER — Encounter (HOSPITAL_COMMUNITY): Payer: Self-pay | Admitting: Emergency Medicine

## 2020-04-09 ENCOUNTER — Ambulatory Visit (HOSPITAL_COMMUNITY)
Admission: EM | Admit: 2020-04-09 | Discharge: 2020-04-09 | Disposition: A | Discharge status: Home / Self Care | Payer: Medicaid Other | Attending: Family Medicine | Admitting: Family Medicine

## 2020-04-09 DIAGNOSIS — R109 Unspecified abdominal pain: Secondary | ICD-10-CM | POA: Diagnosis not present

## 2020-04-09 DIAGNOSIS — N898 Other specified noninflammatory disorders of vagina: Secondary | ICD-10-CM | POA: Diagnosis not present

## 2020-04-09 DIAGNOSIS — Z3202 Encounter for pregnancy test, result negative: Secondary | ICD-10-CM | POA: Diagnosis not present

## 2020-04-09 LAB — POCT URINALYSIS DIPSTICK, ED / UC
Bilirubin Urine: NEGATIVE
Glucose, UA: NEGATIVE mg/dL
Ketones, ur: NEGATIVE mg/dL
Leukocytes,Ua: NEGATIVE
Nitrite: NEGATIVE
Protein, ur: NEGATIVE mg/dL
Specific Gravity, Urine: 1.025 (ref 1.005–1.030)
Urobilinogen, UA: 0.2 mg/dL (ref 0.0–1.0)
pH: 6.5 (ref 5.0–8.0)

## 2020-04-09 LAB — POC URINE PREG, ED: Preg Test, Ur: NEGATIVE

## 2020-04-09 MED ORDER — FLUCONAZOLE 150 MG PO TABS: 150.0000 mg | ORAL_TABLET | Freq: Every day | ORAL | 0 refills | Status: DC

## 2020-04-09 MED ORDER — METRONIDAZOLE 500 MG PO TABS: 500.0000 mg | ORAL_TABLET | Freq: Two times a day (BID) | ORAL | 0 refills | Status: DC

## 2020-04-09 MED ORDER — METRONIDAZOLE 0.75 % VA GEL: 1.0000 | Freq: Every day | VAGINAL | 0 refills | Status: AC

## 2020-04-09 NOTE — ED Provider Notes (Signed)
[facility]    CSN: [id number] Arrival date & time: 04/09/20  1040      History   Chief Complaint Chief Complaint  Patient presents with   Abdominal Pain    HPI ILAH BOULE is a 29 y.o. female.   Patient is a 29 year old female who presents today with lower abdominal cramping that has been intermittent.  Dysuria and vaginal discharge x3 days.  Reporting odor to discharge.  History of STDs and BV.  Feels like her symptoms are consistent with BV.No LMP recorded. Patient has had an injection.  No fever, chills, nausea or vomiting.      Past Medical History:  Diagnosis Date   Heroin abuse (HCC) 05/22/2018   History of cocaine use    12/2015 NOB [facility] UDS   Vaginal delivery 2009    Patient Active Problem List   Diagnosis Date Noted   Drug overdose 02/23/2018   AKI (acute kidney injury) (HCC) 02/23/2018   History of cesarean delivery, currently pregnant 07/03/2016   History of cocaine use    History of cesarean delivery, antepartum 04/15/2016   Supervision of high risk pregnancy, antepartum 02/20/2016   History of preterm delivery 02/20/2016   H/O herpes genitalis 02/20/2016    Past Surgical History:  Procedure Laterality Date   CESAREAN SECTION     CESAREAN SECTION N/A 07/03/2016   Procedure: CESAREAN SECTION with BTL;  Surgeon: Benwood Bing, MD;  [facility];  Service: Obstetrics;  Laterality: N/A;    OB History    Gravida  5   Para  3   Term  2   Preterm  1   AB  2   Living  3     SAB  2   TAB      Ectopic      Multiple  0   Live Births  3            Home Medications    Prior to Admission medications   Medication Sig Start Date End Date Taking? Authorizing Provider  valACYclovir (VALTREX) 500 MG tablet Take 500 mg by mouth 2 (two) times daily.   Yes [provider]  fluconazole (DIFLUCAN) 150 MG tablet Take 1 tablet (150 mg total) by mouth daily. 04/09/20   Dahlia Byes A, NP    metroNIDAZOLE (METROGEL) 0.75 % vaginal gel Place 1 Applicatorful vaginally at bedtime for 5 days. 04/09/20 04/14/20  Janace Aris, NP  Miconazole Nitrate (MONISTAT 3 COMBINATION PACK VA) Place vaginally.    [provider]  Ferrous Fumarate (HEMOCYTE - 106 MG FE) 324 (106 Fe) MG TABS tablet Take 1 tablet (106 mg of iron total) by mouth 2 (two) times daily. Patient not taking: Reported on 02/23/2018 07/06/16 09/07/19  Arabella Merles, CNM    Family History History reviewed. No pertinent family history.  Social History Social History   Tobacco Use   Smoking status: Former Smoker    Packs/day: 0.10    Types: Cigarettes   Smokeless tobacco: Never Used  Building services engineer Use: Never assessed  Substance Use Topics   Alcohol use: No    Comment: socially   Drug use: No     Allergies   Patient has no known allergies.   Review of Systems Review of Systems   Physical Exam Triage Vital Signs ED Triage Vitals  Enc Vitals Group     BP 04/09/20 1144 112/74     Pulse Rate 04/09/20 1144 75  Resp 04/09/20 1144 18     Temp 04/09/20 1144 98.2 F (36.8 C)     Temp Source 04/09/20 1144 Oral     SpO2 04/09/20 1144 100 %     Weight --      Height --      Head Circumference --      Peak Flow --      Pain Score 04/09/20 1143 8     Pain Loc --      Pain Edu? --      Excl. in GC? --    No data found.  Updated Vital Signs BP 112/74 (BP Location: Right Arm)    Pulse 75    Temp 98.2 F (36.8 C) (Oral)    Resp 18    SpO2 100%   Visual Acuity Right Eye Distance:   Left Eye Distance:   Bilateral Distance:    Right Eye Near:   Left Eye Near:    Bilateral Near:     Physical Exam Vitals and nursing note reviewed.  Constitutional:      General: She is not in acute distress.    Appearance: Normal appearance. She is not ill-appearing, toxic-appearing or diaphoretic.  HENT:     Head: Normocephalic.     Nose: Nose normal.  Eyes:     Conjunctiva/sclera:  Conjunctivae normal.  Pulmonary:     Effort: Pulmonary effort is normal.  Musculoskeletal:        General: Normal range of motion.     Cervical back: Normal range of motion.  Skin:    General: Skin is warm and dry.     Findings: No rash.  Neurological:     Mental Status: She is alert.  Psychiatric:        Mood and Affect: Mood normal.      UC Treatments / Results  Labs (all labs ordered are listed, but only abnormal results are displayed) Labs Reviewed  POCT URINALYSIS DIPSTICK, ED / UC - Abnormal; Notable for the following components:      Result Value   Hgb urine dipstick TRACE (*)    All other components within normal limits  POC URINE PREG, ED  CERVICOVAGINAL ANCILLARY ONLY    EKG   Radiology No results found.  Procedures Procedures (including critical care time)  Medications Ordered in UC Medications - No data to display  Initial Impression / Assessment and Plan / UC Course  I have reviewed the triage vital signs and the nursing notes.  Pertinent labs & imaging results that were available during my care of the patient were reviewed by me and considered in my medical decision making (see chart for details).     Vaginal discharge Most likely bacterial vaginosis based on symptoms and history.  Patient does have some concern for STDs.  She has past medical history of STDs.  Swab sent for testing. Treating with metronidazole gel Fluconazole to cover for use. Follow up as needed for continued or worsening symptoms  Final Clinical Impressions(s) / UC Diagnoses   Final diagnoses:  Vaginal discharge     Discharge Instructions     Medicine as prescribed Swab sent for testing.  We will call with any positive results.  Follow up as needed for continued or worsening symptoms     ED Prescriptions    Medication Sig Dispense Auth. Provider   metroNIDAZOLE (FLAGYL) 500 MG tablet  (Status: Discontinued) Take 1 tablet (500 mg total) by mouth 2 (two) times  daily. 14  tablet Kacen Mellinger A, NP   metroNIDAZOLE (METROGEL) 0.75 % vaginal gel Place 1 Applicatorful vaginally at bedtime for 5 days. 50 g Yomaira Solar A, NP   fluconazole (DIFLUCAN) 150 MG tablet Take 1 tablet (150 mg total) by mouth daily. 2 tablet Dahlia Byes A, NP     PDMP not reviewed this encounter.   Dahlia Byes A, NP 04/09/20 1310

## 2020-04-09 NOTE — ED Triage Notes (Signed)
Patient presents to [facility] for assessment of lower abdominal pain, dysuria, and vaginal discharge x 3 days.

## 2020-04-09 NOTE — Discharge Instructions (Addendum)
Medicine as prescribed Swab sent for testing.  We will call with any positive results.  Follow up as needed for continued or worsening symptoms

## 2020-04-09 NOTE — ED Notes (Signed)
Patient provided cup for urine sample.  States she just urinated.

## 2020-04-10 LAB — CERVICOVAGINAL ANCILLARY ONLY
Bacterial Vaginitis (gardnerella): POSITIVE — AB
Candida Glabrata: NEGATIVE
Candida Vaginitis: NEGATIVE
Chlamydia: NEGATIVE
Comment: NEGATIVE
Comment: NEGATIVE
Comment: NEGATIVE
Comment: NEGATIVE
Comment: NEGATIVE
Comment: NORMAL
Neisseria Gonorrhea: NEGATIVE
Trichomonas: NEGATIVE

## 2020-05-21 DIAGNOSIS — Z3042 Encounter for surveillance of injectable contraceptive: Secondary | ICD-10-CM | POA: Diagnosis not present

## 2020-05-21 DIAGNOSIS — Z3200 Encounter for pregnancy test, result unknown: Secondary | ICD-10-CM | POA: Diagnosis not present

## 2020-05-21 DIAGNOSIS — Z3009 Encounter for other general counseling and advice on contraception: Secondary | ICD-10-CM | POA: Diagnosis not present

## 2020-09-04 DIAGNOSIS — Z3042 Encounter for surveillance of injectable contraceptive: Secondary | ICD-10-CM | POA: Diagnosis not present

## 2020-09-04 DIAGNOSIS — Z3200 Encounter for pregnancy test, result unknown: Secondary | ICD-10-CM | POA: Diagnosis not present

## 2020-09-04 DIAGNOSIS — Z3009 Encounter for other general counseling and advice on contraception: Secondary | ICD-10-CM | POA: Diagnosis not present

## 2020-10-21 DIAGNOSIS — Z5181 Encounter for therapeutic drug level monitoring: Secondary | ICD-10-CM | POA: Diagnosis not present

## 2020-10-22 ENCOUNTER — Encounter (HOSPITAL_COMMUNITY): Payer: Self-pay

## 2020-10-22 ENCOUNTER — Other Ambulatory Visit: Payer: Self-pay

## 2020-10-22 ENCOUNTER — Ambulatory Visit (HOSPITAL_COMMUNITY)
Admission: EM | Admit: 2020-10-22 | Discharge: 2020-10-22 | Disposition: A | Discharge status: Home / Self Care | Payer: Medicaid Other | Attending: Emergency Medicine | Admitting: Emergency Medicine

## 2020-10-22 DIAGNOSIS — R319 Hematuria, unspecified: Secondary | ICD-10-CM | POA: Insufficient documentation

## 2020-10-22 DIAGNOSIS — Z3202 Encounter for pregnancy test, result negative: Secondary | ICD-10-CM

## 2020-10-22 DIAGNOSIS — R350 Frequency of micturition: Secondary | ICD-10-CM | POA: Diagnosis not present

## 2020-10-22 DIAGNOSIS — R3915 Urgency of urination: Secondary | ICD-10-CM

## 2020-10-22 DIAGNOSIS — Z113 Encounter for screening for infections with a predominantly sexual mode of transmission: Secondary | ICD-10-CM | POA: Diagnosis not present

## 2020-10-22 DIAGNOSIS — N390 Urinary tract infection, site not specified: Secondary | ICD-10-CM | POA: Diagnosis not present

## 2020-10-22 DIAGNOSIS — R102 Pelvic and perineal pain: Secondary | ICD-10-CM

## 2020-10-22 LAB — POCT URINALYSIS DIPSTICK, ED / UC
Bilirubin Urine: NEGATIVE
Glucose, UA: NEGATIVE mg/dL
Ketones, ur: NEGATIVE mg/dL
Nitrite: POSITIVE — AB
Protein, ur: 100 mg/dL — AB
Specific Gravity, Urine: >=1.03 (ref 1.005–1.030)
Urobilinogen, UA: 0.2 mg/dL (ref 0.0–1.0)
pH: 5.5 (ref 5.0–8.0)

## 2020-10-22 LAB — POC URINE PREG, ED: Preg Test, Ur: NEGATIVE

## 2020-10-22 MED ORDER — CEPHALEXIN 500 MG PO CAPS: 500.0000 mg | ORAL_CAPSULE | Freq: Two times a day (BID) | ORAL | 0 refills | Status: AC

## 2020-10-22 NOTE — Discharge Instructions (Signed)
Take the antibiotic as directed.  The urine culture is pending.  We will call you if it shows the need to change or discontinue your antibiotic.    Your vaginal tests are pending.  If your test results are positive, we will call you.  You and your sexual partner(s) may require treatment at that time.  Do not have sexual activity for at least 7 days.    Follow up with your primary care provider or gynecologist if your symptoms are not improving.

## 2020-10-22 NOTE — ED Triage Notes (Signed)
Pt presents with vaginal pain, urinary frequency and urgency x 2 days. She states she has had UTI's before with no sxs.

## 2020-10-22 NOTE — ED Provider Notes (Signed)
[facility]    CSN: [id number] Arrival date & time: 10/22/20  1746      History   Chief Complaint Chief Complaint  Patient presents with  . Urinary Frequency  . Urinary Urgency  . Vaginal Pain    HPI Kelly Gordon is a 30 y.o. female.   Patient presents with suprapubic pressure, urinary frequency and urgency x2 days.  She also reports white-pink vaginal discharge today.  She denies fever, chills, dysuria, hematuria back pain, pelvic pain, nausea, vomiting, diarrhea, or other symptoms.  No treatments attempted at home.  Her medical history includes cocaine abuse, heroin abuse, drug overdose, HSV.  The history is provided by the patient and medical records.    Past Medical History:  Diagnosis Date  . Heroin abuse (HCC) 05/22/2018  . History of cocaine use    12/2015 NOB [facility] UDS  . Vaginal delivery 2009    Patient Active Problem List   Diagnosis Date Noted  . Drug overdose 02/23/2018  . AKI (acute kidney injury) (HCC) 02/23/2018  . History of cesarean delivery, currently pregnant 07/03/2016  . History of cocaine use   . History of cesarean delivery, antepartum 04/15/2016  . Supervision of high risk pregnancy, antepartum 02/20/2016  . History of preterm delivery 02/20/2016  . H/O herpes genitalis 02/20/2016    Past Surgical History:  Procedure Laterality Date  . CESAREAN SECTION    . CESAREAN SECTION N/A 07/03/2016   Procedure: CESAREAN SECTION with BTL;  Surgeon: Bonita Bing, MD;  Location: [facility];  Service: Obstetrics;  Laterality: N/A;    OB History    Gravida  5   Para  3   Term  2   Preterm  1   AB  2   Living  3     SAB  2   IAB      Ectopic      Multiple  0   Live Births  3            Home Medications    Prior to Admission medications   Medication Sig Start Date End Date Taking? Authorizing Provider  cephALEXin (KEFLEX) 500 MG capsule Take 1 capsule (500 mg total) by mouth 2 (two) times daily for 5  days. 10/22/20 10/27/20 Yes Mickie Bail, NP  fluconazole (DIFLUCAN) 150 MG tablet Take 1 tablet (150 mg total) by mouth daily. 04/09/20   Janace Aris, NP  Miconazole Nitrate (MONISTAT 3 COMBINATION PACK VA) Place vaginally.    [provider]  valACYclovir (VALTREX) 500 MG tablet Take 500 mg by mouth 2 (two) times daily.    [provider]  Ferrous Fumarate (HEMOCYTE - 106 MG FE) 324 (106 Fe) MG TABS tablet Take 1 tablet (106 mg of iron total) by mouth 2 (two) times daily. Patient not taking: Reported on 02/23/2018 07/06/16 09/07/19  Arabella Merles, CNM    Family History History reviewed. No pertinent family history.  Social History Social History   Tobacco Use  . Smoking status: Former Smoker    Packs/day: 0.10    Types: Cigarettes  . Smokeless tobacco: Never Used  Substance Use Topics  . Alcohol use: No    Comment: socially  . Drug use: No     Allergies   Patient has no known allergies.   Review of Systems Review of Systems  Constitutional: Negative for chills and fever.  HENT: Negative for ear pain and sore throat.   Eyes: Negative for pain  and visual disturbance.  Respiratory: Negative for cough and shortness of breath.   Cardiovascular: Negative for chest pain and palpitations.  Gastrointestinal: Positive for abdominal pain. Negative for diarrhea and vomiting.  Genitourinary: Positive for frequency, urgency and vaginal discharge. Negative for dysuria, hematuria and pelvic pain.  Musculoskeletal: Negative for arthralgias and back pain.  Skin: Negative for color change and rash.  Neurological: Negative for seizures and syncope.  All other systems reviewed and are negative.    Physical Exam Triage Vital Signs ED Triage Vitals  Enc Vitals Group     BP      Pulse      Resp      Temp      Temp src      SpO2      Weight      Height      Head Circumference      Peak Flow      Pain Score      Pain Loc      Pain Edu?      Excl. in GC?    No  data found.  Updated Vital Signs BP 118/78 (BP Location: Left Arm)   Pulse 77   Temp 98.5 F (36.9 C) (Oral)   Resp 17   SpO2 99%   Visual Acuity Right Eye Distance:   Left Eye Distance:   Bilateral Distance:    Right Eye Near:   Left Eye Near:    Bilateral Near:     Physical Exam Vitals and nursing note reviewed.  Constitutional:      General: She is not in acute distress.    Appearance: She is well-developed. She is not ill-appearing.  HENT:     Head: Normocephalic and atraumatic.     Mouth/Throat:     Mouth: Mucous membranes are moist.  Eyes:     Conjunctiva/sclera: Conjunctivae normal.  Cardiovascular:     Rate and Rhythm: Normal rate and regular rhythm.     Heart sounds: Normal heart sounds.  Pulmonary:     Effort: Pulmonary effort is normal. No respiratory distress.     Breath sounds: Normal breath sounds.  Abdominal:     General: Bowel sounds are normal.     Palpations: Abdomen is soft.     Tenderness: There is no abdominal tenderness. There is no right CVA tenderness, left CVA tenderness, guarding or rebound.  Musculoskeletal:     Cervical back: Neck supple.  Skin:    General: Skin is warm and dry.  Neurological:     General: No focal deficit present.     Mental Status: She is alert and oriented to person, place, and time.     Gait: Gait normal.  Psychiatric:        Mood and Affect: Mood normal.        Behavior: Behavior normal.      UC Treatments / Results  Labs (all labs ordered are listed, but only abnormal results are displayed) Labs Reviewed  POCT URINALYSIS DIPSTICK, ED / UC - Abnormal; Notable for the following components:      Result Value   Hgb urine dipstick MODERATE (*)    Protein, ur 100 (*)    Nitrite POSITIVE (*)    Leukocytes,Ua SMALL (*)    All other components within normal limits  URINE CULTURE  POC URINE PREG, ED  CERVICOVAGINAL ANCILLARY ONLY    EKG   Radiology No results found.  Procedures Procedures (including  critical care time)  Medications Ordered in UC Medications - No data to display  Initial Impression / Assessment and Plan / UC Course  I have reviewed the triage vital signs and the nursing notes.  Pertinent labs & imaging results that were available during my care of the patient were reviewed by me and considered in my medical decision making (see chart for details).   Urinary tract infection with hematuria.  Screening for STDs.  Urine pregnancy negative.  Urine culture pending.  Treating with Keflex.  Discussed with patient that we will call her if the urine culture shows the need to change or discontinue the antibiotic.  Patient obtained vaginal self swab for testing. Discussed with patient that she may require treatment if her test results are positive.  Instructed her to abstain from sexual activity for at least 7 days.  Instructed her to follow-up with her PCP or OB/GYN if her symptoms are not improving.  Patient agrees to plan of care.   Final Clinical Impressions(s) / UC Diagnoses   Final diagnoses:  Urinary tract infection with hematuria, site unspecified  Screen for STD (sexually transmitted disease)     Discharge Instructions     Take the antibiotic as directed.  The urine culture is pending.  We will call you if it shows the need to change or discontinue your antibiotic.    Your vaginal tests are pending.  If your test results are positive, we will call you.  You and your sexual partner(s) may require treatment at that time.  Do not have sexual activity for at least 7 days.    Follow up with your primary care provider or gynecologist if your symptoms are not improving.         ED Prescriptions    Medication Sig Dispense Auth. Provider   cephALEXin (KEFLEX) 500 MG capsule Take 1 capsule (500 mg total) by mouth 2 (two) times daily for 5 days. 10 capsule Mickie Bail, NP     PDMP not reviewed this encounter.   Mickie Bail, NP 10/22/20 [phone]

## 2020-10-23 ENCOUNTER — Telehealth (HOSPITAL_COMMUNITY): Payer: Self-pay | Admitting: Emergency Medicine

## 2020-10-23 LAB — CERVICOVAGINAL ANCILLARY ONLY
Bacterial Vaginitis (gardnerella): POSITIVE — AB
Candida Glabrata: NEGATIVE
Candida Vaginitis: NEGATIVE
Chlamydia: NEGATIVE
Comment: NEGATIVE
Comment: NEGATIVE
Comment: NEGATIVE
Comment: NEGATIVE
Comment: NEGATIVE
Comment: NORMAL
Neisseria Gonorrhea: NEGATIVE
Trichomonas: NEGATIVE

## 2020-10-23 MED ORDER — METRONIDAZOLE 500 MG PO TABS: 500.0000 mg | ORAL_TABLET | Freq: Two times a day (BID) | ORAL | 0 refills | Status: DC

## 2020-10-25 LAB — URINE CULTURE: Culture: >=100000 — AB

## 2020-10-28 ENCOUNTER — Telehealth (HOSPITAL_COMMUNITY): Payer: Self-pay | Admitting: Emergency Medicine

## 2020-10-28 MED ORDER — NITROFURANTOIN MONOHYD MACRO 100 MG PO CAPS: 100.0000 mg | ORAL_CAPSULE | Freq: Two times a day (BID) | ORAL | 0 refills | Status: DC

## 2020-10-29 ENCOUNTER — Telehealth (HOSPITAL_COMMUNITY): Payer: Self-pay | Admitting: Emergency Medicine

## 2020-11-04 DIAGNOSIS — Z5181 Encounter for therapeutic drug level monitoring: Secondary | ICD-10-CM | POA: Diagnosis not present

## 2020-11-18 DIAGNOSIS — Z5181 Encounter for therapeutic drug level monitoring: Secondary | ICD-10-CM | POA: Diagnosis not present

## 2020-12-02 DIAGNOSIS — Z5181 Encounter for therapeutic drug level monitoring: Secondary | ICD-10-CM | POA: Diagnosis not present

## 2020-12-20 DIAGNOSIS — Z113 Encounter for screening for infections with a predominantly sexual mode of transmission: Secondary | ICD-10-CM | POA: Diagnosis not present

## 2020-12-20 DIAGNOSIS — A609 Anogenital herpesviral infection, unspecified: Secondary | ICD-10-CM | POA: Diagnosis not present

## 2020-12-20 DIAGNOSIS — N760 Acute vaginitis: Secondary | ICD-10-CM | POA: Diagnosis not present

## 2020-12-25 DIAGNOSIS — Z5181 Encounter for therapeutic drug level monitoring: Secondary | ICD-10-CM | POA: Diagnosis not present

## 2021-01-07 DIAGNOSIS — Z5181 Encounter for therapeutic drug level monitoring: Secondary | ICD-10-CM | POA: Diagnosis not present

## 2021-05-23 DIAGNOSIS — Z30013 Encounter for initial prescription of injectable contraceptive: Secondary | ICD-10-CM | POA: Diagnosis not present

## 2021-05-23 DIAGNOSIS — Z3200 Encounter for pregnancy test, result unknown: Secondary | ICD-10-CM | POA: Diagnosis not present

## 2022-04-15 ENCOUNTER — Ambulatory Visit (HOSPITAL_COMMUNITY)
Admission: EM | Admit: 2022-04-15 | Discharge: 2022-04-15 | Disposition: A | Discharge status: Home / Self Care | Payer: Medicaid Other | Attending: Family Medicine | Admitting: Family Medicine

## 2022-04-15 DIAGNOSIS — R102 Pelvic and perineal pain: Secondary | ICD-10-CM

## 2022-04-15 LAB — POCT URINALYSIS DIPSTICK, ED / UC
Bilirubin Urine: NEGATIVE
Glucose, UA: NEGATIVE mg/dL
Ketones, ur: NEGATIVE mg/dL
Leukocytes,Ua: NEGATIVE
Nitrite: NEGATIVE
Protein, ur: NEGATIVE mg/dL
Specific Gravity, Urine: <=1.005 (ref 1.005–1.030)
Urobilinogen, UA: 0.2 mg/dL (ref 0.0–1.0)
pH: 6 (ref 5.0–8.0)

## 2022-04-15 LAB — POC URINE PREG, ED: Preg Test, Ur: NEGATIVE

## 2022-04-15 MED ORDER — METRONIDAZOLE 0.75 % VA GEL: 1.0000 | Freq: Every day | VAGINAL | 0 refills | Status: AC

## 2022-04-15 MED ORDER — VALACYCLOVIR HCL 500 MG PO TABS: 500.0000 mg | ORAL_TABLET | Freq: Two times a day (BID) | ORAL | 2 refills | Status: AC

## 2022-04-15 NOTE — ED Triage Notes (Signed)
Pt c/o lower abd pelvic pain for week. Reports having no urinary problems, vaginal discharge or odor.   Pt unsure if infection.   Pt needs refill of Valtrex.

## 2022-04-15 NOTE — ED Provider Notes (Addendum)
[facility]    CSN: [id number] Arrival date & time: 04/15/22  1708      History   Chief Complaint Chief Complaint  Patient presents with   Abdominal Pain    HPI HADYN AZER is a 31 y.o. female.    Abdominal Pain  Here for suprapubic pain that began about a week ago.  It is fairly constant.  No fever or chills and no nausea or vomiting or diarrhea.  Last bowel movement was this morning and fairly normal for her.  No blood in the stool.  No dysuria and no hematuria.  She had a little bit of low back pain today and yesterday.  She has had some mild congestion and cough for several weeks that is consistent with allergies.  Is not really bothering her that much.  She cannot remember when her last period was, and she cannot tell me when she quit having periods.  She does feel like this is similar to when she has had BV in the past.  She prefers vaginal treatment to the oral treatment  Past Medical History:  Diagnosis Date   Heroin abuse ([place]) 05/22/2018   History of cocaine use    12/2015 NOB [facility] UDS   Vaginal delivery 2009    Patient Active Problem List   Diagnosis Date Noted   Drug overdose 02/23/2018   AKI (acute kidney injury) ([place]) 02/23/2018   History of cesarean delivery, currently pregnant 07/03/2016   History of cocaine use    History of cesarean delivery, antepartum 04/15/2016   Supervision of high risk pregnancy, antepartum 02/20/2016   History of preterm delivery 02/20/2016   H/O herpes genitalis 02/20/2016    Past Surgical History:  Procedure Laterality Date   CESAREAN SECTION     CESAREAN SECTION N/A 07/03/2016   Procedure: CESAREAN SECTION with BTL;  Surgeon: Aletha Halim, MD;  Location: [facility];  Service: Obstetrics;  Laterality: N/A;    OB History     Gravida  5   Para  3   Term  2   Preterm  1   AB  2   Living  3      SAB  2   IAB      Ectopic      Multiple  0   Live Births  3             Home Medications    Prior to Admission medications   Medication Sig Start Date End Date Taking? Authorizing Provider  metroNIDAZOLE (METROGEL VAGINAL) 0.75 % vaginal gel Place 1 Applicatorful vaginally at bedtime for 5 days. 04/15/22 04/20/22 Yes Ryann Pauli, Gwenlyn Perking, MD  valACYclovir (VALTREX) 500 MG tablet Take 1 tablet (500 mg total) by mouth 2 (two) times daily. 04/15/22   Barrett Henle, MD  Ferrous Fumarate (HEMOCYTE - 106 MG FE) 324 (106 Fe) MG TABS tablet Take 1 tablet (106 mg of iron total) by mouth 2 (two) times daily. Patient not taking: Reported on 02/23/2018 07/06/16 09/07/19  Myrtis Ser, CNM    Family History No family history on file.  Social History Social History   Tobacco Use   Smoking status: Former    Packs/day: 0.10    Types: Cigarettes   Smokeless tobacco: Never  Substance Use Topics   Alcohol use: No    Comment: socially   Drug use: No     Allergies   Patient has no known allergies.   Review of Systems Review  of Systems  Gastrointestinal:  Positive for abdominal pain.     Physical Exam Triage Vital Signs ED Triage Vitals  Enc Vitals Group     BP 04/15/22 1816 121/75     Pulse Rate 04/15/22 1816 66     Resp 04/15/22 1816 16     Temp 04/15/22 1816 98.5 F (36.9 C)     Temp Source 04/15/22 1816 Oral     SpO2 04/15/22 1816 98 %     Weight --      Height --      Head Circumference --      Peak Flow --      Pain Score 04/15/22 1815 8     Pain Loc --      Pain Edu? --      Excl. in GC? --    No data found.  Updated Vital Signs BP 121/75 (BP Location: Left Arm)   Pulse 66   Temp 98.5 F (36.9 C) (Oral)   Resp 16   SpO2 98%   Visual Acuity Right Eye Distance:   Left Eye Distance:   Bilateral Distance:    Right Eye Near:   Left Eye Near:    Bilateral Near:     Physical Exam   UC Treatments / Results  Labs (all labs ordered are listed, but only abnormal results are displayed) Labs Reviewed  POCT URINALYSIS  DIPSTICK, ED / UC - Abnormal; Notable for the following components:      Result Value   Hgb urine dipstick TRACE (*)    All other components within normal limits  POC URINE PREG, ED  CERVICOVAGINAL ANCILLARY ONLY    EKG   Radiology No results found.  Procedures Procedures (including critical care time)  Medications Ordered in UC Medications - No data to display  Initial Impression / Assessment and Plan / UC Course  I have reviewed the triage vital signs and the nursing notes.  Pertinent labs & imaging results that were available during my care of the patient were reviewed by me and considered in my medical decision making (see chart for details).       UA shows only a trace of hemoglobin, but no nitrites and no leuks UPT is negative Staff will notify her of any positives on the swab, but I will treat as she desires with the MetroGel for vaginal use Final Clinical Impressions(s) / UC Diagnoses   Final diagnoses:  Pelvic pain in female     Discharge Instructions      The urinalysis was negative. Pregnancy test was negative  We will notify you of any positives on the swab  Use MetroGel once nightly--1 applicatorful in your vagina at bedtime for 5 days  Continue valacyclovir 500 mg--1 tablet 2 times daily  You can use the QR code/website at the back of the summary paperwork to schedule yourself a new patient appointment with primary care       ED Prescriptions     Medication Sig Dispense Auth. Provider   valACYclovir (VALTREX) 500 MG tablet Take 1 tablet (500 mg total) by mouth 2 (two) times daily. 60 tablet Tayjah Lobdell, Janace Aris, MD   metroNIDAZOLE (METROGEL VAGINAL) 0.75 % vaginal gel Place 1 Applicatorful vaginally at bedtime for 5 days. 50 g Zenia Resides, MD      PDMP not reviewed this encounter.   Zenia Resides, MD 04/15/22 1850    Zenia Resides, MD 04/15/22 [id number]

## 2022-04-15 NOTE — Discharge Instructions (Addendum)
The urinalysis was negative. Pregnancy test was negative  We will notify you of any positives on the swab  Use MetroGel once nightly--1 applicatorful in your vagina at bedtime for 5 days  Continue valacyclovir 500 mg--1 tablet 2 times daily  You can use the QR code/website at the back of the summary paperwork to schedule yourself a new patient appointment with primary care

## 2022-04-16 ENCOUNTER — Telehealth (HOSPITAL_COMMUNITY): Payer: Self-pay | Admitting: Emergency Medicine

## 2022-04-16 LAB — CERVICOVAGINAL ANCILLARY ONLY
Bacterial Vaginitis (gardnerella): POSITIVE — AB
Candida Glabrata: NEGATIVE
Candida Vaginitis: POSITIVE — AB
Chlamydia: NEGATIVE
Comment: NEGATIVE
Comment: NEGATIVE
Comment: NEGATIVE
Comment: NEGATIVE
Comment: NEGATIVE
Comment: NORMAL
Neisseria Gonorrhea: NEGATIVE
Trichomonas: NEGATIVE

## 2022-04-16 MED ORDER — FLUCONAZOLE 150 MG PO TABS: 150.0000 mg | ORAL_TABLET | Freq: Once | ORAL | 0 refills | Status: AC

## 2022-08-12 ENCOUNTER — Ambulatory Visit: Payer: Medicaid Other | Admitting: Family Medicine

## 2022-09-30 DIAGNOSIS — H5213 Myopia, bilateral: Secondary | ICD-10-CM | POA: Diagnosis not present

## 2022-11-23 ENCOUNTER — Ambulatory Visit: Payer: Medicaid Other | Admitting: Family Medicine

## 2023-05-31 ENCOUNTER — Encounter (HOSPITAL_COMMUNITY): Payer: Self-pay | Admitting: Emergency Medicine

## 2023-05-31 ENCOUNTER — Ambulatory Visit (HOSPITAL_COMMUNITY)
Admission: EM | Admit: 2023-05-31 | Discharge: 2023-05-31 | Disposition: A | Discharge status: Home / Self Care | Payer: Medicaid Other | Attending: Emergency Medicine | Admitting: Emergency Medicine

## 2023-05-31 DIAGNOSIS — K047 Periapical abscess without sinus: Secondary | ICD-10-CM | POA: Diagnosis not present

## 2023-05-31 MED ORDER — PENICILLIN V POTASSIUM 500 MG PO TABS: 500.0000 mg | ORAL_TABLET | Freq: Three times a day (TID) | ORAL | 0 refills | Status: AC

## 2023-05-31 NOTE — ED Provider Notes (Signed)
[facility]    CSN: [id number] Arrival date & time: 05/31/23  1354    HISTORY   Chief Complaint  Patient presents with   Oral Pain   HPI Kelly Gordon is a pleasant, 32 y.o. female who presents to urgent care today. Patient complains of left-sided mouth pain and sore throat that started on Friday.  Patient states it only hurts at the back on the left side where she is pretty sure that she has a cavity.  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  The history is provided by the patient.   Past Medical History:  Diagnosis Date   Heroin abuse (HCC) 05/22/2018   History of cocaine use    12/2015 NOB [facility] UDS   Vaginal delivery 2009   Patient Active Problem List   Diagnosis Date Noted   Drug overdose 02/23/2018   AKI (acute kidney injury) (HCC) 02/23/2018   History of cesarean delivery, currently pregnant 07/03/2016   History of cocaine use    History of cesarean delivery, antepartum 04/15/2016   Supervision of high risk pregnancy, antepartum 02/20/2016   History of preterm delivery 02/20/2016   H/O herpes genitalis 02/20/2016   Past Surgical History:  Procedure Laterality Date   CESAREAN SECTION     CESAREAN SECTION N/A 07/03/2016   Procedure: CESAREAN SECTION with BTL;  Surgeon: Edgewood Bing, MD;  Location: [facility];  Service: Obstetrics;  Laterality: N/A;   OB History     Gravida  5   Para  3   Term  2   Preterm  1   AB  2   Living  3      SAB  2   IAB      Ectopic      Multiple  0   Live Births  3          Home Medications    Prior to Admission medications   Medication Sig Start Date End Date Taking? Authorizing Provider  valACYclovir (VALTREX) 500 MG tablet Take 1 tablet (500 mg total) by mouth 2 (two) times daily. 04/15/22   Zenia Resides, MD  Ferrous Fumarate (HEMOCYTE - 106 MG FE) 324 (106 Fe)  MG TABS tablet Take 1 tablet (106 mg of iron total) by mouth 2 (two) times daily. Patient not taking: Reported on 02/23/2018 07/06/16 09/07/19  Arabella Merles, CNM    Family History History reviewed. No pertinent family history. Social History Social History   Tobacco Use   Smoking status: Former    Current packs/day: 0.10    Types: Cigarettes   Smokeless tobacco: Never  Substance Use Topics   Alcohol use: No    Comment: socially   Drug use: No   Allergies   Patient has no known allergies.  Review of Systems Review of Systems Pertinent findings revealed after performing a 14 point review of systems has been noted in the history of present illness.  Physical Exam Vital Signs BP 132/80 (BP Location: Right Arm)   Pulse 79   Temp 98.1 F (36.7 C) (Oral)   Resp 16   LMP 05/31/2023 (Exact Date)   SpO2 97%   No data found.  Physical Exam Vitals and nursing note reviewed.  Constitutional:      General: She is not in acute distress.    Appearance: Normal appearance.  HENT:     Head: Normocephalic and atraumatic.     Mouth/Throat:     Lips: Pink.     Mouth: Mucous membranes are moist.   Eyes:     Pupils: Pupils are equal, round, and reactive to light.  Cardiovascular:     Rate and Rhythm: Normal rate and regular rhythm.  Pulmonary:     Effort: Pulmonary effort is normal.     Breath sounds: Normal breath sounds.  Musculoskeletal:        General: Normal range of motion.     Cervical back: Normal range of motion and neck supple.  Skin:    General: Skin is warm and dry.  Neurological:     General: No focal deficit present.     Mental Status: She is alert and oriented to person, place, and time. Mental status is at baseline.  Psychiatric:        Mood and Affect: Mood normal.        Behavior: Behavior normal.        Thought Content: Thought content normal.        Judgment: Judgment normal.     Visual Acuity Right Eye Distance:   Left Eye Distance:   Bilateral  Distance:    Right Eye Near:   Left Eye Near:    Bilateral Near:     UC Couse / Diagnostics / Procedures:     Radiology No results found.  Procedures Procedures (including critical care time) EKG  Pending results:  Labs Reviewed - No data to display  Medications Ordered in UC: Medications - No data to display  UC Diagnoses / Final Clinical Impressions(s)   I have reviewed the triage vital signs and the nursing notes.  Pertinent labs & imaging results that were available during my care of the patient were reviewed by me and considered in my medical decision making (see chart for details).    Final diagnoses:  Dental abscess   Patient provided with a list of dentist who may take Medicaid based on their website information.  Patient provided with a 14-day course of penicillin and has been advised to schedule an appointment with a dentist as soon as possible to have the tooth evaluated and treated.  Please see discharge instructions below for details of plan of care as provided to patient. ED Prescriptions     Medication Sig Dispense Auth. Provider   penicillin v potassium (VEETID) 500 MG tablet Take 1 tablet (500 mg total) by mouth 3 (three) times daily for 14 days. 42 tablet Theadora Rama Scales, PA-C      PDMP not reviewed this encounter.  Pending results:  Labs Reviewed - No data to display  Discharge Instructions:   Discharge Instructions      To treat infection in your left back tooth, please begin penicillin tablets, 1 tablet every 8 hours for the next 14 days.  After a quick review of local dentist office who say they take Medicaid, I found 3:  [facility]: [phone]  Lorenza Burton, DDS: [phone]  [facility]: [phone]  It is my hope that their websites are reliable and that I am not giving about information.  Please reach out to all of them to see if you can schedule an appointment.  If they do not actually take Medicaid, perhaps they  will know someone that does.  Thank you for visiting [facility].  We appreciate the opportunity to participate in your care.  Disposition Upon Discharge:  Condition: stable for discharge home  Patient presented with an acute illness with associated systemic symptoms and significant discomfort requiring urgent management. In my opinion, this is a condition that a prudent lay person (someone who possesses an average knowledge of health and medicine) may potentially expect to result in complications if not addressed urgently such as respiratory distress, impairment of bodily function or dysfunction of bodily organs.   Routine symptom specific, illness specific and/or disease specific instructions were discussed with the patient and/or caregiver at length.   As such, the patient has been evaluated and assessed, work-up was performed and treatment was provided in alignment with urgent care protocols and evidence based medicine.  Patient/parent/caregiver has been advised that the patient may require follow up for further testing and treatment if the symptoms continue in spite of treatment, as clinically indicated and appropriate.  Patient/parent/caregiver has been advised to return to the [facility] or PCP if no better; to PCP or the Emergency Department if new signs and symptoms develop, or if the current signs or symptoms continue to change or worsen for further workup, evaluation and treatment as clinically indicated and appropriate  The patient will follow up with their current PCP if and as advised. If the patient does not currently have a PCP we will assist them in obtaining one.   The patient may need specialty follow up if the symptoms continue, in spite of conservative treatment and management, for further workup, evaluation, consultation and treatment as clinically indicated and appropriate.  Patient/parent/caregiver verbalized understanding and agreement of plan as discussed.   All questions were addressed during visit.  Please see discharge instructions below for further details of plan.  This office note has been dictated using Teaching laboratory technician.  Unfortunately, this method of dictation can sometimes lead to typographical or grammatical errors.  I apologize for your inconvenience in advance if this occurs.  Please do not hesitate to reach out to me if clarification is needed.      Theadora Rama Scales, PA-C 05/31/23 1626

## 2023-05-31 NOTE — Discharge Instructions (Signed)
To treat infection in your left back tooth, please begin penicillin tablets, 1 tablet every 8 hours for the next 14 days.  After a quick review of local dentist office who say they take Medicaid, I found 3:  [facility]: [phone]  Lorenza Burton, DDS: [phone]  [facility]: [phone]  It is my hope that their websites are reliable and that I am not giving about information.  Please reach out to all of them to see if you can schedule an appointment.  If they do not actually take Medicaid, perhaps they will know someone that does.  Thank you for visiting [facility].  We appreciate the opportunity to participate in your care.

## 2023-05-31 NOTE — ED Triage Notes (Signed)
Pt c/o left side mouth pain and sore throat that started Friday. States it is only at the back left side.
